# Patient Record
Sex: Female | Born: 1980
Health system: Southern US, Community
[De-identification: ages and names within clinical notes are randomized; demographics above are authoritative.]

## PROBLEM LIST (undated history)

## (undated) ENCOUNTER — Ambulatory Visit

## (undated) DIAGNOSIS — K219 Gastro-esophageal reflux disease without esophagitis: Secondary | ICD-10-CM

## (undated) DIAGNOSIS — F419 Anxiety disorder, unspecified: Secondary | ICD-10-CM

## (undated) DIAGNOSIS — G43909 Migraine, unspecified, not intractable, without status migrainosus: Secondary | ICD-10-CM

## (undated) DIAGNOSIS — F32A Depression, unspecified: Secondary | ICD-10-CM

## (undated) DIAGNOSIS — T7840XA Allergy, unspecified, initial encounter: Secondary | ICD-10-CM

## (undated) HISTORY — PX: TUBAL LIGATION: SHX77

---

## 2018-12-03 ENCOUNTER — Encounter (HOSPITAL_COMMUNITY): Payer: Self-pay | Admitting: *Deleted

## 2018-12-03 ENCOUNTER — Emergency Department (HOSPITAL_COMMUNITY)
Admission: EM | Admit: 2018-12-03 | Discharge: 2018-12-03 | Disposition: A | Discharge status: Home / Self Care | Payer: PRIVATE HEALTH INSURANCE | Attending: Emergency Medicine | Admitting: Emergency Medicine

## 2018-12-03 ENCOUNTER — Other Ambulatory Visit: Payer: Self-pay

## 2018-12-03 DIAGNOSIS — Z79899 Other long term (current) drug therapy: Secondary | ICD-10-CM | POA: Diagnosis not present

## 2018-12-03 DIAGNOSIS — L0291 Cutaneous abscess, unspecified: Secondary | ICD-10-CM

## 2018-12-03 DIAGNOSIS — R1013 Epigastric pain: Secondary | ICD-10-CM | POA: Insufficient documentation

## 2018-12-03 DIAGNOSIS — L02411 Cutaneous abscess of right axilla: Secondary | ICD-10-CM | POA: Insufficient documentation

## 2018-12-03 DIAGNOSIS — M79621 Pain in right upper arm: Secondary | ICD-10-CM | POA: Diagnosis present

## 2018-12-03 HISTORY — DX: Migraine, unspecified, not intractable, without status migrainosus: G43.909

## 2018-12-03 HISTORY — DX: Gastro-esophageal reflux disease without esophagitis: K21.9

## 2018-12-03 LAB — CBC WITH DIFFERENTIAL/PLATELET
Abs Immature Granulocytes: 0.02 10*3/uL (ref 0.00–0.07)
Basophils Absolute: 0 10*3/uL (ref 0.0–0.1)
Basophils Relative: 0 %
Eosinophils Absolute: 0.1 10*3/uL (ref 0.0–0.5)
Eosinophils Relative: 2 %
HCT: 39 % (ref 36.0–46.0)
Hemoglobin: 12.7 g/dL (ref 12.0–15.0)
Immature Granulocytes: 0 %
Lymphocytes Relative: 29 %
Lymphs Abs: 2.1 10*3/uL (ref 0.7–4.0)
MCH: 29.9 pg (ref 26.0–34.0)
MCHC: 32.6 g/dL (ref 30.0–36.0)
MCV: 91.8 fL (ref 80.0–100.0)
Monocytes Absolute: 0.5 10*3/uL (ref 0.1–1.0)
Monocytes Relative: 7 %
Neutro Abs: 4.7 10*3/uL (ref 1.7–7.7)
Neutrophils Relative %: 62 %
Platelets: 342 10*3/uL (ref 150–400)
RBC: 4.25 MIL/uL (ref 3.87–5.11)
RDW: 11.8 % (ref 11.5–15.5)
WBC: 7.5 10*3/uL (ref 4.0–10.5)
nRBC: 0 % (ref 0.0–0.2)

## 2018-12-03 LAB — COMPREHENSIVE METABOLIC PANEL
ALT: 14 U/L (ref 0–44)
AST: 14 U/L — ABNORMAL LOW (ref 15–41)
Albumin: 3.6 g/dL (ref 3.5–5.0)
Alkaline Phosphatase: 77 U/L (ref 38–126)
Anion gap: 10 (ref 5–15)
BUN: 10 mg/dL (ref 6–20)
CO2: 21 mmol/L — ABNORMAL LOW (ref 22–32)
Calcium: 8.8 mg/dL — ABNORMAL LOW (ref 8.9–10.3)
Chloride: 108 mmol/L (ref 98–111)
Creatinine, Ser: 0.52 mg/dL (ref 0.44–1.00)
GFR calc Af Amer: >60 mL/min (ref >60)
GFR calc non Af Amer: >60 mL/min (ref >60)
Glucose, Bld: 99 mg/dL (ref 70–99)
Potassium: 3.3 mmol/L — ABNORMAL LOW (ref 3.5–5.1)
Sodium: 139 mmol/L (ref 135–145)
Total Bilirubin: 0.6 mg/dL (ref 0.3–1.2)
Total Protein: 7.3 g/dL (ref 6.5–8.1)

## 2018-12-03 LAB — LIPASE, BLOOD: Lipase: 19 U/L (ref 11–51)

## 2018-12-03 MED ORDER — LIDOCAINE HCL (PF) 2 % IJ SOLN: 5.0000 mL | Freq: Once | INTRAMUSCULAR | Status: DC

## 2018-12-03 MED ORDER — POVIDONE-IODINE 10 % EX SOLN
CUTANEOUS | Status: DC | PRN
  Administered 2018-12-03: 16:00:00 via TOPICAL
  Filled 2018-12-03: qty 15

## 2018-12-03 MED ORDER — MORPHINE SULFATE (PF) 4 MG/ML IV SOLN
4.0000 mg | Freq: Once | INTRAVENOUS | Status: AC
  Administered 2018-12-03: 4 mg via INTRAVENOUS
  Filled 2018-12-03 (×2): qty 1

## 2018-12-03 MED ORDER — ONDANSETRON HCL 4 MG/2ML IJ SOLN
4.0000 mg | Freq: Once | INTRAMUSCULAR | Status: AC
  Administered 2018-12-03: 4 mg via INTRAVENOUS
  Filled 2018-12-03 (×2): qty 2

## 2018-12-03 MED ORDER — CEPHALEXIN 500 MG PO CAPS: 500.0000 mg | ORAL_CAPSULE | Freq: Four times a day (QID) | ORAL | 0 refills | Status: DC

## 2018-12-03 MED ORDER — PANTOPRAZOLE SODIUM 40 MG IV SOLR
40.0000 mg | Freq: Once | INTRAVENOUS | Status: AC
  Administered 2018-12-03: 40 mg via INTRAVENOUS
  Filled 2018-12-03: qty 40

## 2018-12-03 MED ORDER — LIDOCAINE HCL (PF) 1 % IJ SOLN
INTRAMUSCULAR | Status: AC
  Administered 2018-12-03: 30 mL
  Filled 2018-12-03: qty 30

## 2018-12-03 MED ORDER — ONDANSETRON 4 MG PO TBDP: 4.0000 mg | ORAL_TABLET | Freq: Three times a day (TID) | ORAL | 0 refills | Status: DC | PRN

## 2018-12-03 MED ORDER — LIDOCAINE HCL (PF) 1 % IJ SOLN
30.0000 mL | Freq: Once | INTRAMUSCULAR | Status: AC
  Administered 2018-12-03: 16:00:00 30 mL

## 2018-12-03 NOTE — ED Provider Notes (Signed)
[facility] EMERGENCY DEPARTMENT Provider Note   CSN: [id number] Arrival date & time: 12/03/18  1607     History   Chief Complaint Chief Complaint  Patient presents with  . Abdominal Pain  . Abscess    HPI Nichole Garcia is a 38 y.o. female with a history of acid reflux and migraine headache presenting with complaints of epigastric pain which has been present x4 days.  She denies nausea, vomiting, diarrhea, there is no radiation of pain which is constant and burning in character.  She reports history of acid reflux disease.  She denies fevers or chills.  Additionally she reports return of an abscess in her right axilla which is been present for the past week, persistent and worsening with significant pain with palpation.  Last abscess at the site was over 1 year ago.  There is been no drainage from the site.  She has used warm compresses without relief of symptoms.     The history is provided by the patient.    Past Medical History:  Diagnosis Date  . Acid reflux   . Migraines     There are no active problems to display for this patient.   Past Surgical History:  Procedure Laterality Date  . TUBAL LIGATION       OB History   No obstetric history on file.      Home Medications    Prior to Admission medications   Medication Sig Start Date End Date Taking? Authorizing Provider  acetaminophen (TYLENOL) 500 MG tablet Take 500 mg by mouth every 6 (six) hours as needed.   Yes [provider]  diphenhydrAMINE (BENADRYL ALLERGY) 25 MG tablet Take 25 mg by mouth at bedtime as needed.   Yes [provider]  ibuprofen (ADVIL) 200 MG tablet Take 200 mg by mouth every 6 (six) hours as needed.   Yes [provider]  omeprazole (PRILOSEC) 10 MG capsule Take 10 mg by mouth daily.   Yes [provider]  cephALEXin (KEFLEX) 500 MG capsule Take 1 capsule (500 mg total) by mouth 4 (four) times daily. 12/03/18   Idol, Almyra Free, PA-C  ondansetron  (ZOFRAN ODT) 4 MG disintegrating tablet Take 1 tablet (4 mg total) by mouth every 8 (eight) hours as needed for nausea or vomiting. 12/03/18   Evalee Jefferson, PA-C    Family History No family history on file.  Social History Social History   Tobacco Use  . Smoking status: Never Smoker  . Smokeless tobacco: Never Used  Substance Use Topics  . Alcohol use: Yes    Comment: occasionally on weekends  . Drug use: Yes    Types: Marijuana     Allergies   Bee venom and Asa [aspirin]   Review of Systems Review of Systems  Constitutional: Negative for fever.  HENT: Negative for congestion and sore throat.   Eyes: Negative.   Respiratory: Negative for chest tightness and shortness of breath.   Cardiovascular: Negative for chest pain.  Gastrointestinal: Positive for abdominal pain. Negative for nausea.  Genitourinary: Negative.   Musculoskeletal: Negative for arthralgias, joint swelling and neck pain.  Skin: Negative.  Negative for rash and wound.       Negative except as mentioned in HPI.   Neurological: Negative for dizziness, weakness, light-headedness, numbness and headaches.  Psychiatric/Behavioral: Negative.      Physical Exam Updated Vital Signs BP (!) 172/111   Pulse 78   Temp 98.7 F (37.1 C) (Oral)   Resp 16  Ht 5' 4" (1.626 m)   Wt 79.4 kg   LMP 11/23/2018   SpO2 100%   BMI 30.04 kg/m   Physical Exam Vitals signs and nursing note reviewed.  Constitutional:      Appearance: She is well-developed.  HENT:     Head: Normocephalic and atraumatic.  Eyes:     Conjunctiva/sclera: Conjunctivae normal.  Neck:     Musculoskeletal: Normal range of motion.  Cardiovascular:     Rate and Rhythm: Normal rate and regular rhythm.     Heart sounds: Normal heart sounds.  Pulmonary:     Effort: Pulmonary effort is normal.     Breath sounds: Normal breath sounds. No wheezing.  Abdominal:     General: Bowel sounds are normal.     Palpations: Abdomen is soft.      Tenderness: There is abdominal tenderness in the epigastric area. There is no guarding or rebound.     Hernia: No hernia is present.  Musculoskeletal: Normal range of motion.  Skin:    General: Skin is warm and dry.     Comments: Fluctuant abscess right axilla.  She has a well-healed incision from prior I&D.  No red streaking.  No pointing or tenting.  Neurological:     Mental Status: She is alert.      ED Treatments / Results  Labs (all labs ordered are listed, but only abnormal results are displayed) Labs Reviewed  COMPREHENSIVE METABOLIC PANEL - Abnormal; Notable for the following components:      Result Value   Potassium 3.3 (*)    CO2 21 (*)    Calcium 8.8 (*)    AST 14 (*)    All other components within normal limits  CBC WITH DIFFERENTIAL/PLATELET  LIPASE, BLOOD  URINALYSIS, ROUTINE W REFLEX MICROSCOPIC  POC URINE PREG, ED    EKG None  Radiology No results found.  Procedures Procedures (including critical care time)  INCISION AND DRAINAGE Performed by: Evalee Jefferson Consent: Verbal consent obtained. Risks and benefits: risks, benefits and alternatives were discussed Type: abscess  Body area: right axilla  Anesthesia: local infiltration  Incision was made with a scalpel.  Local anesthetic: lidocaine 1% without epinephrine  Anesthetic total: 5 ml  Complexity: complex Blunt dissection to break up loculations  Drainage: purulent  Drainage amount: moderate  Packing material: 1/2 in iodoform gauze  Patient tolerance: Patient tolerated the procedure well with no immediate complications.     Medications Ordered in ED Medications  povidone-iodine (BETADINE) 10 % external solution ( Topical Given by Other 12/03/18 1543)  morphine 4 MG/ML injection 4 mg (4 mg Intravenous Given 12/03/18 1242)  ondansetron (ZOFRAN) injection 4 mg (4 mg Intravenous Given 12/03/18 1241)  lidocaine (PF) (XYLOCAINE) 1 % injection 30 mL (30 mLs Other Given by Other 12/03/18  1543)  pantoprazole (PROTONIX) injection 40 mg (40 mg Intravenous Given 12/03/18 1241)     Initial Impression / Assessment and Plan / ED Course  I have reviewed the triage vital signs and the nursing notes.  Pertinent labs & imaging results that were available during my care of the patient were reviewed by me and considered in my medical decision making (see chart for details).        Delays in obtaining labs while this patient was here, initially because she refused lab work.  I was not notified by the nursing staff of patient's refusal initially, but by the time I was made aware, patient had agreed to have blood work  drawn.  Her CBC resulted, unfortunately the blood work for her c-Met and lipase had hemolyzed, therefore had to be recollected.  Patient was unwilling to wait for lab results, stating significant other had to get to work..  She was given Protonix and a dose of IV morphine when she first arrived.  She had complete resolution of her abdominal symptoms.  Patient will be prescribed antibiotics for her abscess.  She is aware that lab tests are still pending.  Given she is abdominal pain symptom-free and has had no nausea or vomiting while here and a normal WBC count, she was discharged to home, but given strict return precautions to return for any worsening symptoms.  Final Clinical Impressions(s) / ED Diagnoses   Final diagnoses:  Epigastric pain  Abscess    ED Discharge Orders         Ordered    cephALEXin (KEFLEX) 500 MG capsule  4 times daily     12/03/18 1542    ondansetron (ZOFRAN ODT) 4 MG disintegrating tablet  Every 8 hours PRN     12/03/18 1543           Evalee Jefferson, PA-C 12/03/18 1545    Nat Christen, MD 12/04/18 [phone]

## 2018-12-03 NOTE — ED Triage Notes (Signed)
Pt c/o sharp epigastric pain since Sunday. Denies n/v/d. Pt reports hx of acid reflux. Pt also c/o abscess under right arm since Friday.

## 2018-12-03 NOTE — ED Notes (Signed)
Pt reported to RN that she did not want to have the bloodwork done or the IV with medications given. Pt states, "I just want to get this thing lanced and then get out of here".

## 2018-12-03 NOTE — Discharge Instructions (Addendum)
Take the entire course of the antibiotic prescribed.  You may remove the packing from your abscess in 2 days if it is still present.  Start a warm water soak twice daily and gently massage around the site to facilitate any further drainage as discussed.  Your lab tests have resulted today and are normal with no evidence that your abdominal pain was related to gallbladder attack.  This may be acid reflux, I recommend you continue taking your over-the-counter Protonix.  I have also prescribed you some Zofran if you needed for return of any nausea.  Get rechecked if your pain returns or worsens or if you develop fevers or chills or any new symptoms.

## 2018-12-03 NOTE — Progress Notes (Signed)
CSW at bedside to address concern related to lack of primary care. Pt explains that she has recently obtained health insurance through her employer and is agreeable to being assited finding primary care.   CSW assisted pt in exploring providers that are in network with her insurance. CSW attempted to call provider of patients choice without success. VM left requesting call back to make new patient appointment.   Provider information provided on discharge summary instructing patient to call and schedule new patient appointment.   [facility]  Clinical Social Worker  Ph: [phone]

## 2019-08-15 ENCOUNTER — Other Ambulatory Visit: Payer: Self-pay

## 2019-08-15 ENCOUNTER — Ambulatory Visit
Admission: EM | Admit: 2019-08-15 | Discharge: 2019-08-15 | Disposition: A | Discharge status: Home / Self Care | Payer: PRIVATE HEALTH INSURANCE | Attending: Emergency Medicine | Admitting: Emergency Medicine

## 2019-08-15 ENCOUNTER — Encounter: Payer: Self-pay | Admitting: Emergency Medicine

## 2019-08-15 DIAGNOSIS — S161XXA Strain of muscle, fascia and tendon at neck level, initial encounter: Secondary | ICD-10-CM

## 2019-08-15 DIAGNOSIS — M79602 Pain in left arm: Secondary | ICD-10-CM

## 2019-08-15 DIAGNOSIS — M25561 Pain in right knee: Secondary | ICD-10-CM | POA: Diagnosis not present

## 2019-08-15 MED ORDER — NAPROXEN 500 MG PO TABS: 500.0000 mg | ORAL_TABLET | Freq: Two times a day (BID) | ORAL | 0 refills | Status: DC

## 2019-08-15 MED ORDER — CYCLOBENZAPRINE HCL 5 MG PO TABS: 5.0000 mg | ORAL_TABLET | Freq: Every evening | ORAL | 0 refills | Status: DC | PRN
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
## 2019-08-15 NOTE — Discharge Instructions (Addendum)
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
## 2019-08-16 ENCOUNTER — Encounter: Payer: Self-pay | Admitting: Emergency Medicine

## 2019-08-22 ENCOUNTER — Encounter: Payer: Self-pay | Admitting: Emergency Medicine

## 2019-08-22 ENCOUNTER — Ambulatory Visit
Admission: EM | Admit: 2019-08-22 | Discharge: 2019-08-22 | Disposition: A | Discharge status: Home / Self Care | Payer: Self-pay | Attending: Physician Assistant | Admitting: Physician Assistant

## 2019-08-22 ENCOUNTER — Other Ambulatory Visit: Payer: Self-pay

## 2019-08-22 DIAGNOSIS — L2489 Irritant contact dermatitis due to other agents: Secondary | ICD-10-CM

## 2019-08-22 DIAGNOSIS — R6884 Jaw pain: Secondary | ICD-10-CM

## 2019-08-22 DIAGNOSIS — M545 Low back pain, unspecified: Secondary | ICD-10-CM

## 2019-08-22 MED ORDER — DICLOFENAC SODIUM 50 MG PO TBEC: 50.0000 mg | DELAYED_RELEASE_TABLET | Freq: Two times a day (BID) | ORAL | 0 refills | Status: DC

## 2019-08-22 MED ORDER — TRIAMCINOLONE ACETONIDE 0.1 % EX CREA: 1.0000 "application " | TOPICAL_CREAM | Freq: Two times a day (BID) | CUTANEOUS | 0 refills | Status: DC

## 2019-08-22 MED ORDER — TIZANIDINE HCL 2 MG PO TABS: 2.0000 mg | ORAL_TABLET | Freq: Three times a day (TID) | ORAL | 0 refills | Status: DC | PRN

## 2019-08-22 MED ORDER — TRIAMCINOLONE ACETONIDE 0.025 % EX OINT: 1.0000 "application " | TOPICAL_OINTMENT | Freq: Two times a day (BID) | CUTANEOUS | 0 refills | Status: DC

## 2019-08-22 NOTE — Discharge Instructions (Signed)
Start Diclofenac. Do not take ibuprofen (motrin/advil)/ naproxen (aleve) while on diclofenac. Tizanidine as needed, this can make you drowsy, so do not take if you are going to drive, operate heavy machinery, or make important decisions. Ice compress for the facial swelling. Follow up with PCP/orthopedics if symptoms worsen, changes for reevaluation. If experience numbness/tingling of the inner thighs, loss of bladder or bowel control, go to the emergency department for evaluation.   Start triamcinolone cream to the rash during the day. Triamcinolone ointment at night time.
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
## 2019-09-16 ENCOUNTER — Other Ambulatory Visit: Payer: Self-pay | Admitting: Sleep Medicine

## 2019-09-16 ENCOUNTER — Other Ambulatory Visit: Payer: PRIVATE HEALTH INSURANCE

## 2019-09-16 DIAGNOSIS — I471 Supraventricular tachycardia: Secondary | ICD-10-CM

## 2019-09-18 LAB — NOVEL CORONAVIRUS, NAA: SARS-CoV-2, NAA: NOT DETECTED

## 2020-01-18 ENCOUNTER — Ambulatory Visit
Admission: EM | Admit: 2020-01-18 | Discharge: 2020-01-18 | Disposition: A | Discharge status: Home / Self Care | Payer: PRIVATE HEALTH INSURANCE | Attending: Emergency Medicine | Admitting: Emergency Medicine

## 2020-01-18 ENCOUNTER — Other Ambulatory Visit: Payer: Self-pay

## 2020-01-18 DIAGNOSIS — R509 Fever, unspecified: Secondary | ICD-10-CM | POA: Diagnosis not present

## 2020-01-18 DIAGNOSIS — L02412 Cutaneous abscess of left axilla: Secondary | ICD-10-CM

## 2020-01-18 DIAGNOSIS — Z20822 Contact with and (suspected) exposure to covid-19: Secondary | ICD-10-CM

## 2020-01-18 MED ORDER — CEFTRIAXONE SODIUM 1 G IJ SOLR
1.0000 g | Freq: Once | INTRAMUSCULAR | Status: AC
  Administered 2020-01-18: 1 g via INTRAMUSCULAR

## 2020-01-18 MED ORDER — DOXYCYCLINE HYCLATE 100 MG PO CAPS: 100.0000 mg | ORAL_CAPSULE | Freq: Two times a day (BID) | ORAL | 0 refills | Status: AC

## 2020-01-18 MED ORDER — IBUPROFEN 800 MG PO TABS: 800.0000 mg | ORAL_TABLET | Freq: Three times a day (TID) | ORAL | 0 refills | Status: DC

## 2020-01-18 NOTE — Discharge Instructions (Signed)
We gave you an injection of Rocephin Begin doxycycline twice daily for the next 10 days Tylenol and ibuprofen for pain Warm compresses Please follow-up in 2 to 3 days here or emergency room if developing any worsening symptoms

## 2020-01-18 NOTE — ED Triage Notes (Signed)
Pt c/o 3 abscess under lt arm x1wk. Pt c/o body aches with fever x3 days, requesting covid testing.

## 2020-01-18 NOTE — ED Provider Notes (Signed)
[facility]    CSN: [id number] Arrival date & time: 01/18/20  1759      History   Chief Complaint Chief Complaint  Patient presents with  . Abscess    HPI Nichole Garcia is a 40 y.o. female presenting today for evaluation of an abscess.  Reports multiple abscesses to left axilla for approximately 1 week.  Over the past 3 days she has had fevers chills and body aches, unsure if this is related to abscesses/infection or possible Covid.  Reports possible exposure.  Denies any URI symptoms.  HPI  Past Medical History:  Diagnosis Date  . Acid reflux   . Migraines     There are no problems to display for this patient.   Past Surgical History:  Procedure Laterality Date  . TUBAL LIGATION      OB History   No obstetric history on file.      Home Medications    Prior to Admission medications   Medication Sig Start Date End Date Taking? Authorizing Provider  doxycycline (VIBRAMYCIN) 100 MG capsule Take 1 capsule (100 mg total) by mouth 2 (two) times daily for 10 days. 01/18/20 01/28/20 Yes Sani Madariaga C, PA-C  ibuprofen (ADVIL) 800 MG tablet Take 1 tablet (800 mg total) by mouth 3 (three) times daily. 01/18/20  Yes Shawntelle Ungar C, PA-C  acetaminophen (TYLENOL) 500 MG tablet Take 500 mg by mouth every 6 (six) hours as needed.    [provider]  diphenhydrAMINE (BENADRYL ALLERGY) 25 MG tablet Take 25 mg by mouth at bedtime as needed.    [provider]  omeprazole (PRILOSEC) 10 MG capsule Take 10 mg by mouth daily.    [provider]    Family History Family History  Problem Relation Age of Onset  . Hypertension Mother   . Diabetes Mother     Social History Social History   Tobacco Use  . Smoking status: Never Smoker  . Smokeless tobacco: Never Used  Vaping Use  . Vaping Use: Former  Substance Use Topics  . Alcohol use: Yes    Comment: occasionally on weekends  . Drug use: Yes    Types: Marijuana     Allergies    Bee venom and Asa [aspirin]   Review of Systems Review of Systems  Constitutional: Positive for fever. Negative for fatigue.  Eyes: Negative for visual disturbance.  Respiratory: Negative for shortness of breath.   Cardiovascular: Negative for chest pain.  Gastrointestinal: Negative for abdominal pain, nausea and vomiting.  Musculoskeletal: Negative for arthralgias and joint swelling.  Skin: Positive for color change and rash. Negative for wound.  Neurological: Positive for headaches. Negative for dizziness, weakness and light-headedness.     Physical Exam Triage Vital Signs ED Triage Vitals  Enc Vitals Group     BP      Pulse      Resp      Temp      Temp src      SpO2      Weight      Height      Head Circumference      Peak Flow      Pain Score      Pain Loc      Pain Edu?      Excl. in GC?    No data found.  Updated Vital Signs BP (!) 138/96 (BP Location: Left Arm)   Pulse 68   Temp 100.3 F (37.9 C) (Oral)  Resp 18   LMP 12/21/2019   SpO2 100%   Visual Acuity Right Eye Distance:   Left Eye Distance:   Bilateral Distance:    Right Eye Near:   Left Eye Near:    Bilateral Near:     Physical Exam Vitals and nursing note reviewed.  Constitutional:      Appearance: She is well-developed and well-nourished.     Comments: No acute distress  HENT:     Head: Normocephalic and atraumatic.     Nose: Nose normal.     Mouth/Throat:     Comments: Oral mucosa pink and moist, no tonsillar enlargement or exudate. Posterior pharynx patent and nonerythematous, no uvula deviation or swelling. Normal phonation. Eyes:     Conjunctiva/sclera: Conjunctivae normal.  Cardiovascular:     Rate and Rhythm: Normal rate.  Pulmonary:     Effort: Pulmonary effort is normal. No respiratory distress.     Comments: Breathing comfortably at rest, CTABL, no wheezing, rales or other adventitious sounds auscultated Abdominal:     General: There is no distension.   Musculoskeletal:        General: Normal range of motion.     Cervical back: Neck supple.  Skin:    General: Skin is warm and dry.     Comments: Left axilla with multiple abscesses, 2 areas appear fluctuant with surrounding erythema and induration, 2 smaller less fluctuant areas but still tender  Neurological:     Mental Status: She is alert and oriented to person, place, and time.  Psychiatric:        Mood and Affect: Mood and affect normal.      UC Treatments / Results  Labs (all labs ordered are listed, but only abnormal results are displayed) Labs Reviewed  NOVEL CORONAVIRUS, NAA    EKG   Radiology No results found.  Procedures Procedures (including critical care time)  Medications Ordered in UC Medications  cefTRIAXone (ROCEPHIN) injection 1 g (has no administration in time range)    Initial Impression / Assessment and Plan / UC Course  I have reviewed the triage vital signs and the nursing notes.  Pertinent labs & imaging results that were available during my care of the patient were reviewed by me and considered in my medical decision making (see chart for details).     1.  Left axilla abscesses-recommended I&D to 2 areas that are fluctuant, patient became nervous about this and became irritated/hyperventilating, discussed deferring I&D and trial of antibiotics as alternative.  Initiated on doxycycline.  Patient does have fever today, unclear if this is related to abscesses versus possible Covid, opting to cover for possible correlation to abscesses with Rocephin.  Anti-inflammatories for pain.  2.  Fever-Covid test pending, Tylenol and ibuprofen for fever  Discussed strict return precautions. Patient verbalized understanding and is agreeable with plan.  Final Clinical Impressions(s) / UC Diagnoses   Final diagnoses:  Encounter for screening laboratory testing for COVID-19 virus  Abscess of left axilla  Fever, unspecified     Discharge Instructions      We gave you an injection of Rocephin Begin doxycycline twice daily for the next 10 days Tylenol and ibuprofen for pain Warm compresses Please follow-up in 2 to 3 days here or emergency room if developing any worsening symptoms    ED Prescriptions    Medication Sig Dispense Auth. Provider   doxycycline (VIBRAMYCIN) 100 MG capsule Take 1 capsule (100 mg total) by mouth 2 (two) times daily for 10 days.  20 capsule Rachal Dvorsky C, PA-C   ibuprofen (ADVIL) 800 MG tablet Take 1 tablet (800 mg total) by mouth 3 (three) times daily. 21 tablet Treylin Burtch, [place] C, PA-C     PDMP not reviewed this encounter.   Janith Lima, [place] 01/18/20 [phone]

## 2020-01-21 LAB — NOVEL CORONAVIRUS, NAA: SARS-CoV-2, NAA: DETECTED — AB

## 2020-03-18 ENCOUNTER — Other Ambulatory Visit: Payer: Self-pay

## 2020-03-18 ENCOUNTER — Encounter (HOSPITAL_COMMUNITY): Payer: Self-pay

## 2020-03-18 ENCOUNTER — Emergency Department (HOSPITAL_COMMUNITY)
Admission: EM | Admit: 2020-03-18 | Discharge: 2020-03-18 | Disposition: A | Discharge status: Home / Self Care | Payer: PRIVATE HEALTH INSURANCE | Attending: Emergency Medicine | Admitting: Emergency Medicine

## 2020-03-18 DIAGNOSIS — K047 Periapical abscess without sinus: Secondary | ICD-10-CM | POA: Insufficient documentation

## 2020-03-18 DIAGNOSIS — L02412 Cutaneous abscess of left axilla: Secondary | ICD-10-CM | POA: Insufficient documentation

## 2020-03-18 MED ORDER — LIDOCAINE-EPINEPHRINE (PF) 2 %-1:200000 IJ SOLN
20.0000 mL | Freq: Once | INTRAMUSCULAR | Status: DC
  Filled 2020-03-18: qty 20

## 2020-03-18 MED ORDER — AMOXICILLIN-POT CLAVULANATE 875-125 MG PO TABS: 1.0000 | ORAL_TABLET | Freq: Two times a day (BID) | ORAL | 0 refills | Status: DC

## 2020-03-18 MED ORDER — CHLORHEXIDINE GLUCONATE 0.12% ORAL RINSE (MEDLINE KIT): 15.0000 mL | Freq: Two times a day (BID) | OROMUCOSAL | 0 refills | Status: DC

## 2020-03-18 NOTE — Discharge Instructions (Signed)
You were given a prescription for antibiotics. Please take the antibiotic prescription fully.   Please follow-up with a dentist in the next 5 to 7 days for reevaluation.  If you do not have a dentist, resources were provided for dentist in the area in your discharge summary.   I have also given you information to follow-up with a general surgeon in regards to your recurrent abscesses in your armpit.  Please call the office schedule an appointment for follow-up.   Please contact one of the offices that are listed and make an appointment for follow-up.  Please return to the emergency department for any new or worsening symptoms.

## 2020-03-18 NOTE — ED Triage Notes (Signed)
Pt reports abscess to left axilla area that she noticed 3 days ago. Pt also reports abscessed tooth (right,bottom).

## 2020-03-18 NOTE — ED Provider Notes (Signed)
[facility] EMERGENCY DEPARTMENT Provider Note   CSN: [id number] Arrival date & time: 03/18/20  1948     History Chief Complaint  Patient presents with  . Abscess    And toothache -right bottom    Nichole Garcia is a 40 y.o. female.  HPI   40 year old female history of acid reflux, migraines, presents the emergency department today for evaluation of multiple abscesses.  States for the last several days she has noted some swelling and pain to the left axillary area.  She has a history of recurrent abscess to this area.  She also notes some swelling to the right lower jaw and some tooth pain.  She has had no associated fevers or systemic symptoms with this.  She is had no drainage from either of the abscesses.  Past Medical History:  Diagnosis Date  . Acid reflux   . Migraines     There are no problems to display for this patient.   Past Surgical History:  Procedure Laterality Date  . TUBAL LIGATION       OB History   No obstetric history on file.     Family History  Problem Relation Age of Onset  . Hypertension Mother   . Diabetes Mother     Social History   Tobacco Use  . Smoking status: Never Smoker  . Smokeless tobacco: Never Used  Vaping Use  . Vaping Use: Former  Substance Use Topics  . Alcohol use: Yes    Comment: occasionally on weekends  . Drug use: Yes    Types: Marijuana    Home Medications Prior to Admission medications   Medication Sig Start Date End Date Taking? Authorizing Provider  amoxicillin-clavulanate (AUGMENTIN) 875-125 MG tablet Take 1 tablet by mouth every 12 (twelve) hours. 03/18/20  Yes Daina Cara S, PA-C  chlorhexidine gluconate, MEDLINE KIT, (PERIDEX) 0.12 % solution Use as directed 15 mLs in the mouth or throat 2 (two) times daily. 03/18/20  Yes Arcelia Pals S, PA-C  acetaminophen (TYLENOL) 500 MG tablet Take 500 mg by mouth every 6 (six) hours as needed.    [provider]  diphenhydrAMINE (BENADRYL ALLERGY) 25 MG  tablet Take 25 mg by mouth at bedtime as needed.    [provider]  ibuprofen (ADVIL) 800 MG tablet Take 1 tablet (800 mg total) by mouth 3 (three) times daily. 01/18/20   Wieters, Hallie C, PA-C  omeprazole (PRILOSEC) 10 MG capsule Take 10 mg by mouth daily.    [provider]    Allergies    Bee venom and Asa [aspirin]  Review of Systems   Review of Systems  Constitutional: Negative for fever.  Skin:       abscess    Physical Exam Updated Vital Signs BP (!) 151/98 (BP Location: Right Arm)   Pulse 95   Temp 98.7 F (37.1 C) (Oral)   Resp 18   Ht 5' 4" (1.626 m)   Wt 72.6 kg   SpO2 100%   BMI 27.46 kg/m   Physical Exam Vitals and nursing note reviewed.  Constitutional:      General: She is not in acute distress.    Appearance: She is well-developed and well-nourished.  HENT:     Head: Normocephalic and atraumatic.     Mouth/Throat:     Comments: TTP to the right lower molar with fluctuance noted to the adjacent gumline.  There is no submandibular or sublingual swelling.  No trismus noted.  Tolerating secretions. Eyes:  Conjunctiva/sclera: Conjunctivae normal.  Cardiovascular:     Rate and Rhythm: Normal rate.  Pulmonary:     Effort: Pulmonary effort is normal.  Musculoskeletal:        General: Normal range of motion.     Cervical back: Neck supple.  Skin:    General: Skin is warm and dry.     Comments: 1 x 3 cm area of fluctuance to the left axillary area with a 1 x 1 cm area of fluctuance just inferior to this.  Both areas are tender to palpation.  Neurological:     Mental Status: She is alert.  Psychiatric:        Mood and Affect: Mood and affect normal.     ED Results / Procedures / Treatments   Labs (all labs ordered are listed, but only abnormal results are displayed) Labs Reviewed - No data to display  EKG None  Radiology No results found.  Procedures .Marland KitchenIncision and Drainage  Date/Time: 03/18/2020 11:34 PM Performed by:  Rodney Booze, PA-C Authorized by: Rodney Booze, PA-C   Consent:    Consent obtained:  Verbal   Consent given by:  Patient   Risks discussed:  Bleeding, incomplete drainage, pain and damage to other organs   Alternatives discussed:  No treatment Universal protocol:    Procedure explained and questions answered to patient or proxy's satisfaction: yes     Relevant documents present and verified: yes     Test results available : yes     Imaging studies available: yes     Required blood products, implants, devices, and special equipment available: yes     Site/side marked: yes     Immediately prior to procedure, a time out was called: yes     Patient identity confirmed:  Verbally with patient Location:    Type:  Abscess   Size:  1   Location:  Mouth Procedure type:    Complexity:  Simple Procedure details:    Incision types:  Single straight   Incision depth:  Subcutaneous   Scalpel blade:  11   Drainage:  Purulent   Drainage amount:  Scant Post-procedure details:    Procedure completion:  Tolerated well, no immediate complications .Marland KitchenIncision and Drainage  Date/Time: 03/18/2020 11:35 PM Performed by: Rodney Booze, PA-C Authorized by: Rodney Booze, PA-C   Consent:    Consent obtained:  Verbal   Consent given by:  Patient   Risks discussed:  Bleeding, incomplete drainage, pain and damage to other organs   Alternatives discussed:  No treatment Universal protocol:    Procedure explained and questions answered to patient or proxy's satisfaction: yes     Relevant documents present and verified: yes     Test results available : yes     Imaging studies available: yes     Required blood products, implants, devices, and special equipment available: yes     Site/side marked: yes     Immediately prior to procedure, a time out was called: yes     Patient identity confirmed:  Verbally with patient Location:    Type:  Abscess   Size:  3   Location: left  axilla. Pre-procedure details:    Skin preparation:  Chlorhexidine with alcohol Sedation:    Sedation type:  None Anesthesia:    Anesthesia method:  Local infiltration   Local anesthetic:  Lidocaine 2% WITH epi Procedure type:    Complexity:  Complex Procedure details:    Incision types:  Single straight   Incision depth:  Subcutaneous   Scalpel blade:  11   Wound management:  Probed and deloculated   Drainage:  Purulent   Drainage amount:  Moderate Post-procedure details:    Procedure completion:  Tolerated well, no immediate complications ..Incision and Drainage  Date/Time: 03/18/2020 11:35 PM Performed by: Couture, Cortni S, PA-C Authorized by: Couture, Cortni S, PA-C   Consent:    Consent obtained:  Verbal   Consent given by:  Patient   Risks discussed:  Bleeding, incomplete drainage, pain and damage to other organs   Alternatives discussed:  No treatment Universal protocol:    Procedure explained and questions answered to patient or proxy's satisfaction: yes     Relevant documents present and verified: yes     Test results available : yes     Imaging studies available: yes     Required blood products, implants, devices, and special equipment available: yes     Site/side marked: yes     Immediately prior to procedure, a time out was called: yes     Patient identity confirmed:  Verbally with patient Location:    Type:  Abscess   Size:  1   Location: left axilla. Pre-procedure details:    Skin preparation:  Chlorhexidine with alcohol Sedation:    Sedation type:  None Anesthesia:    Anesthesia method:  Local infiltration   Local anesthetic:  Lidocaine 2% WITH epi Procedure type:    Complexity:  Simple Procedure details:    Incision types:  Single straight   Incision depth:  Subcutaneous   Scalpel blade:  11   Wound management:  Probed and deloculated   Drainage:  Purulent   Drainage amount:  Scant Post-procedure details:    Procedure completion:  Tolerated  well, no immediate complications     Medications Ordered in ED Medications  lidocaine-EPINEPHrine (XYLOCAINE W/EPI) 2 %-1:200000 (PF) injection 20 mL (has no administration in time range)    ED Course  I have reviewed the triage vital signs and the nursing notes.  Pertinent labs & imaging results that were available during my care of the patient were reviewed by me and considered in my medical decision making (see chart for details).    MDM Rules/Calculators/A&P                          Patient with skin abscess amenable to incision and drainage.  Abscesses were not large enough to warrant packing or drain. Encouraged home warm soaks and flushing.    Will d/c to home with antibiotics and oral mouthwash.  Will give information for dentist for follow-up regarding her dental abscess and will also give information to follow-up with general surgery regarding her recurrent axillary abscesses.  Advised on return precautions.  She voices understanding of the plan and reasons to return. all Questions answered.  Patient stable for discharge.    Final Clinical Impression(s) / ED Diagnoses Final diagnoses:  Dental abscess  Abscess of left axilla    Rx / DC Orders ED Discharge Orders         Ordered    amoxicillin-clavulanate (AUGMENTIN) 875-125 MG tablet  Every 12 hours        03/18/20 2333    chlorhexidine gluconate, MEDLINE KIT, (PERIDEX) 0.12 % solution  2 times daily        03/18/20 2333           Couture, Cortni S, PA-C 03/18/20 2336      Zammit, Joseph, MD 03/22/20 2307  

## 2020-07-28 ENCOUNTER — Ambulatory Visit
Admission: EM | Admit: 2020-07-28 | Discharge: 2020-07-28 | Disposition: A | Discharge status: Home / Self Care | Payer: 59 | Attending: Nurse Practitioner | Admitting: Nurse Practitioner

## 2020-07-28 DIAGNOSIS — L02212 Cutaneous abscess of back [any part, except buttock]: Secondary | ICD-10-CM | POA: Diagnosis not present

## 2020-07-28 DIAGNOSIS — L02411 Cutaneous abscess of right axilla: Secondary | ICD-10-CM | POA: Diagnosis not present

## 2020-07-28 MED ORDER — HYDROCODONE-ACETAMINOPHEN 5-325 MG PO TABS: 1.0000 | ORAL_TABLET | ORAL | 0 refills | Status: DC | PRN

## 2020-07-28 MED ORDER — CLINDAMYCIN HCL 150 MG PO CAPS: 300.0000 mg | ORAL_CAPSULE | Freq: Three times a day (TID) | ORAL | 0 refills | Status: AC

## 2020-07-28 MED ORDER — SULFAMETHOXAZOLE-TRIMETHOPRIM 800-160 MG PO TABS: 1.0000 | ORAL_TABLET | Freq: Two times a day (BID) | ORAL | 0 refills | Status: AC

## 2020-07-28 NOTE — ED Provider Notes (Signed)
[facility]    CSN: [id number] Arrival date & time: 07/28/20  1247      History   Chief Complaint Chief Complaint  Patient presents with   Abscess    Mid back and right armpit     HPI Nichole Garcia is a 40 y.o. female.   Subjective:   Nichole Garcia is a 40 y.o. female who presents for evaluation of a multiple cutaneous abscess. Lesion is located in the right axilla and midback area. Onset was 3 days ago. Symptoms have waxed and waned but are worse overall.  Patient has been applying warm compresses as well as an over-the-counter product called "boil ease" with some relief in her symptoms.  Patient states that she has 3 abscesses on her back.  2 of the abscesses have spontaneously drained but there is 1 remaining that has not drained. Abscess has associated symptoms of spontaneous drainage and pain. She denies any fevers or myalgias. Patient does have previous history of cutaneous abscesses. Patient does not have diabetes.  The following portions of the patient's history were reviewed and updated as appropriate: allergies, current medications, past family history, past medical history, past social history, past surgical history, and problem list.      Past Medical History:  Diagnosis Date   Acid reflux    Migraines     There are no problems to display for this patient.   Past Surgical History:  Procedure Laterality Date   TUBAL LIGATION      OB History   No obstetric history on file.      Home Medications    Prior to Admission medications   Medication Sig Start Date End Date Taking? Authorizing Provider  clindamycin (CLEOCIN) 150 MG capsule Take 2 capsules (300 mg total) by mouth 3 (three) times daily for 7 days. 07/28/20 08/04/20 Yes Lurline IdolMurrill, Alayah Knouff, FNP  HYDROcodone-acetaminophen (NORCO/VICODIN) 5-325 MG tablet Take 1 tablet by mouth every 4 (four) hours as needed for severe pain. 07/28/20  Yes Lurline IdolMurrill, Trestin Vences, FNP  sulfamethoxazole-trimethoprim  (BACTRIM DS) 800-160 MG tablet Take 1 tablet by mouth 2 (two) times daily for 7 days. 07/28/20 08/04/20 Yes Lurline IdolMurrill, Elisah Parmer, FNP  acetaminophen (TYLENOL) 500 MG tablet Take 500 mg by mouth every 6 (six) hours as needed.    [provider]  diphenhydrAMINE (BENADRYL ALLERGY) 25 MG tablet Take 25 mg by mouth at bedtime as needed.    [provider]  ibuprofen (ADVIL) 800 MG tablet Take 1 tablet (800 mg total) by mouth 3 (three) times daily. 01/18/20   Wieters, Hallie C, PA-C  omeprazole (PRILOSEC) 10 MG capsule Take 10 mg by mouth daily.    [provider]    Family History Family History  Problem Relation Age of Onset   Hypertension Mother    Diabetes Mother     Social History Social History   Tobacco Use   Smoking status: Never   Smokeless tobacco: Never  Vaping Use   Vaping Use: Former  Substance Use Topics   Alcohol use: Yes    Comment: occasionally on weekends   Drug use: Yes    Types: Marijuana     Allergies   Bee venom and Asa [aspirin]   Review of Systems Review of Systems  Constitutional:  Negative for fever.  Skin:  Positive for wound.  All other systems reviewed and are negative.   Physical Exam Triage Vital Signs ED Triage Vitals  Enc Vitals Group     BP 07/28/20 1441 Marland Kitchen(!)  147/94     Pulse Rate 07/28/20 1441 95     Resp 07/28/20 1441 18     Temp 07/28/20 1441 98 F (36.7 C)     Temp Source 07/28/20 1441 Oral     SpO2 07/28/20 1441 99 %     Weight --      Height --      Head Circumference --      Peak Flow --      Pain Score 07/28/20 1442 10     Pain Loc --      Pain Edu? --      Excl. in GC? --    No data found.  Updated Vital Signs BP (!) 147/94 (BP Location: Left Arm)   Pulse 95   Temp 98 F (36.7 C) (Oral)   Resp 18   SpO2 99%   Visual Acuity Right Eye Distance:   Left Eye Distance:   Bilateral Distance:    Right Eye Near:   Left Eye Near:    Bilateral Near:     Physical Exam Vitals reviewed.   Constitutional:      Appearance: Normal appearance.  HENT:     Head: Normocephalic.  Cardiovascular:     Rate and Rhythm: Normal rate and regular rhythm.  Pulmonary:     Effort: Pulmonary effort is normal.     Breath sounds: Normal breath sounds.  Musculoskeletal:        General: Normal range of motion.     Cervical back: Normal range of motion and neck supple.  Skin:    General: Skin is warm and dry.       Neurological:     General: No focal deficit present.     Mental Status: She is alert and oriented to person, place, and time.  Psychiatric:        Mood and Affect: Mood normal.        Behavior: Behavior normal.     UC Treatments / Results  Labs (all labs ordered are listed, but only abnormal results are displayed) Labs Reviewed  AEROBIC CULTURE W GRAM STAIN (SUPERFICIAL SPECIMEN)    EKG   Radiology No results found.  Procedures Incision and Drainage  Date/Time: 07/28/2020 3:21 PM Performed by: Lurline Idol, FNP Authorized by: Lurline Idol, FNP   Consent:    Consent obtained:  Verbal   Consent given by:  Patient   Risks, benefits, and alternatives were discussed: yes     Risks discussed:  Bleeding, incomplete drainage, pain and infection   Alternatives discussed:  No treatment Location:    Type:  Abscess   Location:  Upper extremity   Upper extremity location: right axilla. Pre-procedure details:    Skin preparation:  Chlorhexidine with alcohol Sedation:    Sedation type:  None Anesthesia:    Anesthesia method:  Local infiltration   Local anesthetic:  Lidocaine 1% w/o epi Procedure type:    Complexity:  Simple Procedure details:    Incision types:  Single straight   Wound management:  Probed and deloculated   Drainage:  Purulent and bloody   Drainage amount:  Moderate   Wound treatment:  Drain placed   Packing materials:  1/4 in iodoform gauze Post-procedure details:    Procedure completion:  Tolerated well, no immediate  complications (including critical care time)  Medications Ordered in UC Medications - No data to display  Initial Impression / Assessment and Plan / UC Course  I have reviewed the triage vital signs  and the nursing notes.  Pertinent labs & imaging results that were available during my care of the patient were reviewed by me and considered in my medical decision making (see chart for details).     40 year old female presenting with an abscess to the right axilla and the back area.  The abscess to the axilla has been drained. Abscess to the back not ready for I&D at this time.  Patient is uncomfortable but nontoxic.  Afebrile.  Plan:  Apply hot compresses frequently to promote drainage. Oral antibiotics -- see med orders. I & D procedure as above. RTC in 2 days to have packing removed from the right axilla abscess and to be reevaluated for possible I&D of the back.  Today's evaluation has revealed no signs of a dangerous process. Discussed diagnosis with patient and/or guardian. Patient and/or guardian aware of their diagnosis, possible red flag symptoms to watch out for and need for close follow up. Patient and/or guardian understands verbal and written discharge instructions. Patient and/or guardian comfortable with plan and disposition.  Patient and/or guardian has a clear mental status at this time, good insight into illness (after discussion and teaching) and has clear judgment to make decisions regarding their care  This care was provided during an unprecedented National Emergency due to the Novel Coronavirus (COVID-19) pandemic. COVID-19 infections and transmission risks place heavy strains on healthcare resources.  As this pandemic evolves, our facility, providers, and staff strive to respond fluidly, to remain operational, and to provide care relative to available resources and information. Outcomes are unpredictable and treatments are without well-defined guidelines. Further, the  impact of COVID-19 on all aspects of urgent care, including the impact to patients seeking care for reasons other than COVID-19, is unavoidable during this national emergency. At this time of the global pandemic, management of patients has significantly changed, even for non-COVID positive patients given high local and regional COVID volumes at this time requiring high healthcare system and resource utilization. The standard of care for management of both COVID suspected and non-COVID suspected patients continues to change rapidly at the local, regional, national, and global levels. This patient was worked up and treated to the best available but ever changing evidence and resources available at this current time.   Documentation was completed with the aid of voice recognition software. Transcription may contain typographical errors.    Final Clinical Impressions(s) / UC Diagnoses   Final diagnoses:  Abscess of right axilla  Abscess of back     Discharge Instructions      Take antibiotics as prescribed Take over-the-counter pain medicine (aleve, tylenol or advil) as needed Use the Norco only for severe pain.  Do not drive taking this. Continue warm, moist compresses to your abscesses Come back in 2 days to have the packing removed from your abscess and to be evaluated for possible drainage of the abscess to your back      ED Prescriptions     Medication Sig Dispense Auth. Provider   sulfamethoxazole-trimethoprim (BACTRIM DS) 800-160 MG tablet Take 1 tablet by mouth 2 (two) times daily for 7 days. 14 tablet Josalyn Dettmann, Millard, FNP   clindamycin (CLEOCIN) 150 MG capsule Take 2 capsules (300 mg total) by mouth 3 (three) times daily for 7 days. 42 capsule Lurline Idol, FNP   HYDROcodone-acetaminophen (NORCO/VICODIN) 5-325 MG tablet Take 1 tablet by mouth every 4 (four) hours as needed for severe pain. 10 tablet Lurline Idol, FNP      I have reviewed the  PDMP during this  encounter.   Lurline Idol, [place] 07/28/20 1554

## 2020-07-28 NOTE — ED Triage Notes (Signed)
Three day h/o abscess on mid back and right arm pit. Has been taking Aleve, Tylenol and using warm compresses without relief.   Pt also complains of rash on bilateral forearms. Has been using hydrocortisone without improvement

## 2020-07-28 NOTE — Discharge Instructions (Addendum)
Take antibiotics as prescribed Take over-the-counter pain medicine (aleve, tylenol or advil) as needed Use the Norco only for severe pain.  Do not drive taking this. Continue warm, moist compresses to your abscesses Come back in 2 days to have the packing removed from your abscess and to be evaluated for possible drainage of the abscess to your back

## 2020-07-31 ENCOUNTER — Encounter: Payer: Self-pay | Admitting: Emergency Medicine

## 2020-07-31 ENCOUNTER — Ambulatory Visit
Admission: EM | Admit: 2020-07-31 | Discharge: 2020-07-31 | Disposition: A | Discharge status: Home / Self Care | Payer: 59 | Attending: Emergency Medicine | Admitting: Emergency Medicine

## 2020-07-31 ENCOUNTER — Other Ambulatory Visit: Payer: Self-pay

## 2020-07-31 DIAGNOSIS — L02411 Cutaneous abscess of right axilla: Secondary | ICD-10-CM

## 2020-07-31 DIAGNOSIS — Z5189 Encounter for other specified aftercare: Secondary | ICD-10-CM

## 2020-07-31 DIAGNOSIS — L02212 Cutaneous abscess of back [any part, except buttock]: Secondary | ICD-10-CM

## 2020-07-31 LAB — AEROBIC CULTURE W GRAM STAIN (SUPERFICIAL SPECIMEN)
Culture: NORMAL
Special Requests: NORMAL

## 2020-07-31 MED ORDER — HIBICLENS 4 % EX LIQD: Freq: Every day | CUTANEOUS | 0 refills | Status: DC | PRN

## 2020-07-31 NOTE — Discharge Instructions (Addendum)
Give [place] a working phone number so that we contact you if we need to change your antibiotics. Take the medication as written. Take a Tylenol containing product with the motrin up to 3 times a day as needed for pain. This is an effective combination for pain. Take the hydrocodone/Tylenol only for severe pain. Do not take the tylenol and hydrocodone/Tylenol as they both have tylenol in them and too much can hurt your liver.  Return here if you are not getting any better for an I&D.  Continue warm compresses.  Finish antibiotics.  Hibiclens soap keep back and axilla clean.  Return to the ER if you get worse, have a persistent fever >100.4, or for any concerns.  Below is a list of primary care practices who are taking new patients for you to follow-up with.  [facility] internal medicine clinic Ground Floor - [facility], [address] [place], [place], [place] [address] [phone]  [facility] at [facility] [address] [place], [place] [address] [phone]  [facility] [address] [place], [place] [address] [phone]  [facility] [phone] [address] [place] [address]  [facility]: [address] [place] [address]  [phone]  [facility]: [address] [place] [address]  [phone]  [facility] : [address] [place] [address] [phone]  [facility]: [address] [place] [address] [phone]  [facility]: [address] [place] [address] [phone]  Dr. Oneal Grout [address] [facility] [place] [address]  [phone]  Go to www.goodrx.com  or www.costplusdrugs.com to look up your medications. This will give you a list of  where you can find your prescriptions at the most affordable prices. Or ask the pharmacist what the cash price is, or if they have any other discount programs available to help make your medication more affordable. This can be less expensive than what you would pay with insurance.

## 2020-07-31 NOTE — ED Provider Notes (Signed)
HPI  SUBJECTIVE:  Nichole Garcia is a 40 y.o. female who presents for a wound check and packing removal of a right axillary abscess that was performed 3 days ago.  She states it is still draining purulent bloody material, states that the pain is better and the swelling has decreased.  No fevers, body aches.  She also reports a persistent painful erythematous mass on her right back which has not yet drained.  She is not sure if it has gotten any bigger.  No change in the pain.  She has been taking warm baths, clindamycin/Bactrim, Advil, Tylenol, Norco with some improvement in her symptoms.  Symptoms worse with lying on her back or turning over to her right.  She has a past medical history of abscesses.  No history of MRSA, diabetes.  LMP: Beginning of July.  Denies possibility of being pregnant.  PMD: None   Patient had an I&D of the right axilla on 7/14 advised to follow-up here for wound check, packing removal and was started on clindamycin, Bactrim, Norco.  Culture and sensitivity still pending.  Past Medical History:  Diagnosis Date   Acid reflux    Migraines     Past Surgical History:  Procedure Laterality Date   TUBAL LIGATION      Family History  Problem Relation Age of Onset   Hypertension Mother    Diabetes Mother     Social History   Tobacco Use   Smoking status: Never   Smokeless tobacco: Never  Vaping Use   Vaping Use: Former  Substance Use Topics   Alcohol use: Yes    Comment: occasionally on weekends   Drug use: Yes    Types: Marijuana    No current facility-administered medications for this encounter.  Current Outpatient Medications:    chlorhexidine (HIBICLENS) 4 % external liquid, Apply topically daily as needed., Disp: 120 mL, Rfl: 0   clindamycin (CLEOCIN) 150 MG capsule, Take 2 capsules (300 mg total) by mouth 3 (three) times daily for 7 days., Disp: 42 capsule, Rfl: 0   diphenhydrAMINE (BENADRYL) 25 MG tablet, Take 25 mg by mouth at bedtime as needed.,  Disp: , Rfl:    omeprazole (PRILOSEC) 10 MG capsule, Take 10 mg by mouth daily., Disp: , Rfl:    sulfamethoxazole-trimethoprim (BACTRIM DS) 800-160 MG tablet, Take 1 tablet by mouth 2 (two) times daily for 7 days., Disp: 14 tablet, Rfl: 0   acetaminophen (TYLENOL) 500 MG tablet, Take 500 mg by mouth every 6 (six) hours as needed., Disp: , Rfl:    HYDROcodone-acetaminophen (NORCO/VICODIN) 5-325 MG tablet, Take 1 tablet by mouth every 4 (four) hours as needed for severe pain., Disp: 10 tablet, Rfl: 0   ibuprofen (ADVIL) 800 MG tablet, Take 1 tablet (800 mg total) by mouth 3 (three) times daily., Disp: 21 tablet, Rfl: 0  Allergies  Allergen Reactions   Bee Venom Shortness Of Breath and Swelling   Asa [Aspirin] Other (See Comments)    "Makes me feel funny"     ROS  As noted in HPI.   Physical Exam  BP 120/81   Pulse 91   Temp 98.2 F (36.8 C) (Oral)   Resp 16   LMP 07/15/2020   SpO2 98%   Constitutional: Well developed, well nourished, no acute distress Eyes:  EOMI, conjunctiva normal bilaterally HENT: Normocephalic, atraumatic,mucus membranes moist Respiratory: Normal inspiratory effort Cardiovascular: Normal rate GI: nondistended skin: 5 x 3 cm tender area of erythema, induration with central pustule.  Small amount of induration.  No expressible purulent drainage.  0.5 x 0.5 pustule inferior to this.  Marked area of erythema, induration with marker.      Right axilla.  Packing in place.  Mildly tender mass.  No erythema.  Musculoskeletal: no deformities Neurologic: Alert & oriented x 3, no focal neuro deficits Psychiatric: Speech and behavior appropriate   ED Course   Medications - No data to display  Orders Placed This Encounter  Procedures   Apply dressing    Right axilla    Standing Status:   Standing    Number of Occurrences:   1    No results found for this or any previous visit (from the past 24 hour(s)). No results found.  ED Clinical  Impression  1. Abscess of right axilla   2. Abscess of back   3. Wound check, abscess      ED Assessment/Plan  Previous records reviewed.  As noted in HPI.  Removed packing in right axilla without any problem.  There is no expressible purulent drainage, left wound open to heal by secondary intention.  Wound redressed.  Patient declined I&D of the lesion on her back.  She is to continue warm compresses, both antibiotics, ibuprofen/Tylenol containing product as needed.  She is to return here if she gets worse for an I&D.  Home with Hibiclens.  Primary care list for ongoing care, will place order for assistance in finding a PMD.  Discussed labs, MDM, treatment plan, and plan for follow-up with patient. Discussed sn/sx that should prompt return to the ED. patient agrees with plan.   Meds ordered this encounter  Medications   chlorhexidine (HIBICLENS) 4 % external liquid    Sig: Apply topically daily as needed.    Dispense:  120 mL    Refill:  0      *This clinic note was created using Scientist, clinical (histocompatibility and immunogenetics). Therefore, there may be occasional mistakes despite careful proofreading.  ?    Domenick Gong, MD 08/01/20 1116

## 2020-07-31 NOTE — ED Triage Notes (Signed)
Abscess under right arm has been packed, wound check.  Another abscess on back that she would like checked.

## 2020-08-12 ENCOUNTER — Encounter (HOSPITAL_COMMUNITY): Payer: Self-pay

## 2020-08-13 ENCOUNTER — Other Ambulatory Visit: Payer: Self-pay

## 2020-08-13 ENCOUNTER — Ambulatory Visit (HOSPITAL_COMMUNITY)
Admission: EM | Admit: 2020-08-13 | Discharge: 2020-08-13 | Disposition: A | Discharge status: Home / Self Care | Payer: 59 | Attending: Physician Assistant | Admitting: Physician Assistant

## 2020-08-13 ENCOUNTER — Encounter (HOSPITAL_COMMUNITY): Payer: Self-pay | Admitting: *Deleted

## 2020-08-13 DIAGNOSIS — R112 Nausea with vomiting, unspecified: Secondary | ICD-10-CM | POA: Diagnosis not present

## 2020-08-13 DIAGNOSIS — K529 Noninfective gastroenteritis and colitis, unspecified: Secondary | ICD-10-CM | POA: Diagnosis not present

## 2020-08-13 DIAGNOSIS — R197 Diarrhea, unspecified: Secondary | ICD-10-CM | POA: Diagnosis not present

## 2020-08-13 LAB — COMPREHENSIVE METABOLIC PANEL
ALT: 25 U/L (ref 0–44)
AST: 23 U/L (ref 15–41)
Albumin: 3.8 g/dL (ref 3.5–5.0)
Alkaline Phosphatase: 68 U/L (ref 38–126)
Anion gap: 5 (ref 5–15)
BUN: 12 mg/dL (ref 6–20)
CO2: 24 mmol/L (ref 22–32)
Calcium: 8.8 mg/dL — ABNORMAL LOW (ref 8.9–10.3)
Chloride: 108 mmol/L (ref 98–111)
Creatinine, Ser: 0.58 mg/dL (ref 0.44–1.00)
GFR, Estimated: >60 mL/min (ref >60)
Glucose, Bld: 93 mg/dL (ref 70–99)
Potassium: 3.7 mmol/L (ref 3.5–5.1)
Sodium: 137 mmol/L (ref 135–145)
Total Bilirubin: 0.4 mg/dL (ref 0.3–1.2)
Total Protein: 7.3 g/dL (ref 6.5–8.1)

## 2020-08-13 LAB — CBC WITH DIFFERENTIAL/PLATELET
Abs Immature Granulocytes: 0.02 10*3/uL (ref 0.00–0.07)
Basophils Absolute: 0 10*3/uL (ref 0.0–0.1)
Basophils Relative: 1 %
Eosinophils Absolute: 0 10*3/uL (ref 0.0–0.5)
Eosinophils Relative: 0 %
HCT: 35.2 % — ABNORMAL LOW (ref 36.0–46.0)
Hemoglobin: 12 g/dL (ref 12.0–15.0)
Immature Granulocytes: 0 %
Lymphocytes Relative: 24 %
Lymphs Abs: 1.4 10*3/uL (ref 0.7–4.0)
MCH: 29.8 pg (ref 26.0–34.0)
MCHC: 34.1 g/dL (ref 30.0–36.0)
MCV: 87.3 fL (ref 80.0–100.0)
Monocytes Absolute: 0.3 10*3/uL (ref 0.1–1.0)
Monocytes Relative: 5 %
Neutro Abs: 4 10*3/uL (ref 1.7–7.7)
Neutrophils Relative %: 70 %
Platelets: 376 10*3/uL (ref 150–400)
RBC: 4.03 MIL/uL (ref 3.87–5.11)
RDW: 13.2 % (ref 11.5–15.5)
WBC: 5.8 10*3/uL (ref 4.0–10.5)
nRBC: 0 % (ref 0.0–0.2)

## 2020-08-13 LAB — LIPASE, BLOOD: Lipase: 23 U/L (ref 11–51)

## 2020-08-13 MED ORDER — LIDOCAINE VISCOUS HCL 2 % MT SOLN
OROMUCOSAL | Status: AC
  Filled 2020-08-13: qty 15

## 2020-08-13 MED ORDER — ONDANSETRON 4 MG PO TBDP: 4.0000 mg | ORAL_TABLET | Freq: Three times a day (TID) | ORAL | 0 refills | Status: DC | PRN

## 2020-08-13 MED ORDER — OMEPRAZOLE 20 MG PO CPDR: 20.0000 mg | DELAYED_RELEASE_CAPSULE | Freq: Every day | ORAL | 0 refills | Status: DC

## 2020-08-13 MED ORDER — ALUM & MAG HYDROXIDE-SIMETH 200-200-20 MG/5ML PO SUSP
ORAL | Status: AC
  Filled 2020-08-13: qty 30

## 2020-08-13 MED ORDER — LIDOCAINE VISCOUS HCL 2 % MT SOLN
15.0000 mL | Freq: Once | OROMUCOSAL | Status: AC
  Administered 2020-08-13: 15 mL via ORAL

## 2020-08-13 MED ORDER — ALUM & MAG HYDROXIDE-SIMETH 200-200-20 MG/5ML PO SUSP
30.0000 mL | Freq: Once | ORAL | Status: AC
  Administered 2020-08-13: 30 mL via ORAL

## 2020-08-13 NOTE — ED Triage Notes (Signed)
Pt has GERD pt ate fried chicken and [employer]  yesterday and woke up after mid night with reflux ,vomiting and diarrhea .

## 2020-08-13 NOTE — ED Provider Notes (Signed)
[facility]    CSN: [id number] Arrival date & time: 08/13/20  1527      History   Chief Complaint Chief Complaint  Patient presents with   Emesis   Diarrhea    HPI Nichole Garcia is a 40 y.o. female.   Patient presents today with a several hour history of GI symptoms.  She reports abdominal pain, nausea, vomiting, diarrhea.  Denies any fever, chest pain, shortness of breath, cough, congestion, melena, hematochezia, hematemesis.  Reports symptoms began after drinking beer and eating barbecue and fried chicken.  Reports household sick contacts with similar symptoms.  She does have a history of acid reflux and states current symptoms are similar to previous episodes of this condition.  She has been without her PPI (omeprazole) for approximately 1 week.  Denies history of H. pylori or previous endoscopy.  She denies previous abdominal surgery.  Reports associated upper abdominal pain which is rated 5/6 on a 0-10 pain scale, described as aching, worse with palpation, no alleviating factors identified.  Pain was severe enough that she cannot wear her bra earlier today.  She denies history of pancreatitis and reports that she did not drink an excessive amount but only had 1-2 beers prior to symptom onset.  She is requesting a refill of her acid reflux medication if appropriate today.  Has not seen a GI specialist in the past.   Past Medical History:  Diagnosis Date   Acid reflux    Migraines     There are no problems to display for this patient.   Past Surgical History:  Procedure Laterality Date   TUBAL LIGATION      OB History   No obstetric history on file.      Home Medications    Prior to Admission medications   Medication Sig Start Date End Date Taking? Authorizing Provider  acetaminophen (TYLENOL) 500 MG tablet Take 500 mg by mouth every 6 (six) hours as needed.   Yes [provider]  chlorhexidine (HIBICLENS) 4 % external liquid Apply topically  daily as needed. 07/31/20  Yes Domenick Gong, MD  diphenhydrAMINE (BENADRYL) 25 MG tablet Take 25 mg by mouth at bedtime as needed.   Yes [provider]  ibuprofen (ADVIL) 800 MG tablet Take 1 tablet (800 mg total) by mouth 3 (three) times daily. 01/18/20  Yes Wieters, Hallie C, PA-C  omeprazole (PRILOSEC) 20 MG capsule Take 1 capsule (20 mg total) by mouth daily. 08/13/20  Yes Cornelius Marullo K, PA-C  ondansetron (ZOFRAN ODT) 4 MG disintegrating tablet Take 1 tablet (4 mg total) by mouth every 8 (eight) hours as needed for nausea or vomiting. 08/13/20  Yes Naidelin Gugliotta, Noberto Retort, PA-C    Family History Family History  Problem Relation Age of Onset   Hypertension Mother    Diabetes Mother     Social History Social History   Tobacco Use   Smoking status: Never   Smokeless tobacco: Never  Vaping Use   Vaping Use: Former  Substance Use Topics   Alcohol use: Yes    Comment: occasionally on weekends   Drug use: Yes    Types: Marijuana     Allergies   Bee venom and Asa [aspirin]   Review of Systems Review of Systems  Constitutional:  Positive for activity change and appetite change. Negative for fatigue and fever.  HENT:  Negative for congestion, sinus pressure, sneezing and sore throat.   Respiratory:  Negative for cough and shortness of breath.  Cardiovascular:  Negative for chest pain.  Gastrointestinal:  Positive for abdominal pain, diarrhea, nausea and vomiting. Negative for blood in stool and constipation.  Musculoskeletal:  Negative for arthralgias and myalgias.  Neurological:  Negative for dizziness, light-headedness and headaches.    Physical Exam Triage Vital Signs ED Triage Vitals  Enc Vitals Group     BP 08/13/20 1622 136/89     Pulse Rate 08/13/20 1622 91     Resp 08/13/20 1622 20     Temp 08/13/20 1622 99 F (37.2 C)     Temp src --      SpO2 08/13/20 1622 100 %     Weight --      Height --      Head Circumference --      Peak Flow --      Pain Score  08/13/20 1625 0     Pain Loc --      Pain Edu? --      Excl. in GC? --    No data found.  Updated Vital Signs BP 136/89   Pulse 91   Temp 99 F (37.2 C)   Resp 20   LMP 07/15/2020   SpO2 100%   Visual Acuity Right Eye Distance:   Left Eye Distance:   Bilateral Distance:    Right Eye Near:   Left Eye Near:    Bilateral Near:     Physical Exam Vitals reviewed.  Constitutional:      General: She is awake. She is not in acute distress.    Appearance: Normal appearance. She is normal weight. She is not ill-appearing.     Comments: Very pleasant female appears age in no acute distress  HENT:     Head: Normocephalic and atraumatic.  Cardiovascular:     Rate and Rhythm: Normal rate and regular rhythm.     Heart sounds: Normal heart sounds, S1 normal and S2 normal. No murmur heard. Pulmonary:     Effort: Pulmonary effort is normal.     Breath sounds: Normal breath sounds. No wheezing, rhonchi or rales.     Comments: Clear to auscultation bilaterally Abdominal:     General: Bowel sounds are normal.     Palpations: Abdomen is soft.     Tenderness: There is abdominal tenderness in the right upper quadrant, epigastric area and left upper quadrant. There is no right CVA tenderness, left CVA tenderness, guarding or rebound.     Comments: Mild tenderness palpation throughout upper abdomen.  No evidence of acute abdomen exam.  Psychiatric:        Behavior: Behavior is cooperative.     UC Treatments / Results  Labs (all labs ordered are listed, but only abnormal results are displayed) Labs Reviewed  CBC WITH DIFFERENTIAL/PLATELET  COMPREHENSIVE METABOLIC PANEL  LIPASE, BLOOD    EKG   Radiology No results found.  Procedures Procedures (including critical care time)  Medications Ordered in UC Medications  alum & mag hydroxide-simeth (MAALOX/MYLANTA) 200-200-20 MG/5ML suspension 30 mL (30 mLs Oral Given 08/13/20 1730)    And  lidocaine (XYLOCAINE) 2 % viscous mouth  solution 15 mL (15 mLs Oral Given 08/13/20 1731)    Initial Impression / Assessment and Plan / UC Course  I have reviewed the triage vital signs and the nursing notes.  Pertinent labs & imaging results that were available during my care of the patient were reviewed by me and considered in my medical decision making (see chart for details).  Patient was given GI cocktail with improvement of symptoms in clinic today.  Discussed likely viral etiology given household sick contacts with similar symptoms.  Patient was encouraged to eat a bland diet and avoid spicy/acidic/fatty foods.  CBC, CMP, lipase obtained today-results pending.  She was given Zofran to be used at home with instruction to push fluids to prevent dehydration.  She was restarted on PPI therapy as she does have a history of GERD.  I discussed that we are unable to obtain any imaging of her abdomen if she has worsening abdominal pain she needs to go to the emergency room.  Discussed alarm symptoms that warrant emergent evaluation.  Strict return precautions given to which patient expressed understanding.  Final Clinical Impressions(s) / UC Diagnoses   Final diagnoses:  Gastroenteritis  Nausea vomiting and diarrhea     Discharge Instructions      I suspect that you have a stomach bug.  Please eat a very bland diet and avoid spicy/acidic/fatty foods.  Make sure you are drinking plenty of fluid.  I have called in your omeprazole to help with your acid reflux so please start taking this in the morning on empty stomach as you have previously been prescribed.  Use Zofran up to 3 times a day as needed for nausea symptoms.  If you have any worsening symptoms including recurrent abdominal pain you need to go to the emergency room to consider a CT scan.  We will contact you if your lab work is abnormal.     ED Prescriptions     Medication Sig Dispense Auth. Provider   omeprazole (PRILOSEC) 20 MG capsule Take 1 capsule (20 mg total)  by mouth daily. 30 capsule Cathy Crounse K, PA-C   ondansetron (ZOFRAN ODT) 4 MG disintegrating tablet Take 1 tablet (4 mg total) by mouth every 8 (eight) hours as needed for nausea or vomiting. 20 tablet Tayte Childers, Noberto Retort, PA-C      PDMP not reviewed this encounter.   Jeani Hawking, PA-C 08/13/20 1749

## 2020-08-13 NOTE — Discharge Instructions (Addendum)
I suspect that you have a stomach bug.  Please eat a very bland diet and avoid spicy/acidic/fatty foods.  Make sure you are drinking plenty of fluid.  I have called in your omeprazole to help with your acid reflux so please start taking this in the morning on empty stomach as you have previously been prescribed.  Use Zofran up to 3 times a day as needed for nausea symptoms.  If you have any worsening symptoms including recurrent abdominal pain you need to go to the emergency room to consider a CT scan.  We will contact you if your lab work is abnormal.

## 2020-11-03 NOTE — Progress Notes (Signed)
Subjective:    Nichole Garcia - 40 y.o. female MRN [id number]  Date of birth: 11/07/1980  HPI  Nichole Garcia is to establish care.   Current issues and/or concerns: BLOOD PRESSURE: Adherence with salt restriction (low-salt diet): [x]  Yes  []  No Exercise: not outside of work Home Monitoring?: []  Yes    [x]  No Monitoring Frequency: []  Yes    [x]  No Home BP results range: []  Yes    [x]  No Smoking [x]  Yes, was smoking marijuana but stopped on last week SOB? []  Yes    [x]  No   2. ANXIETY/DEPRESSION: Began when her mother passed away in June 16, 2020. Had a close relationship with her mother. Mother was very supportive of everything even financially. Shortly after mother passed away she lost her job. 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 Reports her mother was very influential for her working in the healthcare field. Was working two jobs but quit one because of scheduling. Prefers to stay busy so that she always has something to do. Currently feeling up and down. Sometimes sad and feeling like in the slums. Wants to sleep a lot. Has 4 children ages 79 years-old to 61 years-old who is her motivation to improve throughout life. Anxious mood: yes  Excessive worrying: yes Irritability: yes  Sweating: no Nausea: no Palpitations:no Hyperventilation: no Panic attacks: sometimes  Depressed mood: yes Insomnia: yes, usually sleeping at no more than 2 hour intervals  Feelings of worthlessness: no  Feelings of guilt: no  Suicidal ideations, homicidal ideations, self-harm: no  Crying spells: feels like crying but tries to hold it in Recent Stressors/Life Changes: yes   Relationship problems: recently ended a relationship because was draining   Family stress: no     Financial stress: no    Job stress: no    Recent death/loss: yes, mother June 16, 2020  3. BUMP: Reports bump at left armpit. Happens frequently. One time occurred underneath breast. Reports when happens goes to the  hospital and has lanced but always returns.   ROS per HPI    Health Maintenance:  Health Maintenance Due  Topic Date Due   COVID-19 Vaccine (1) Never done   HIV Screening  Never done   Hepatitis C Screening  Never done   TETANUS/TDAP  Never done   PAP SMEAR-Modifier  Never done   INFLUENZA VACCINE  Never done     Past Medical History: Patient Active Problem List   Diagnosis Date Noted   Anxiety and depression 11/08/2020    Social History   reports that she has never smoked. She has never used smokeless tobacco. She reports current alcohol use. She reports current drug use. Drug: Marijuana.   Family History  family history includes Diabetes in her mother; Hypertension in her mother.   Medications: reviewed and updated   Objective:   Physical Exam BP (!) 146/96   Pulse 85   Ht 5\' 4"  (1.626 m)   Wt 153 lb 2 oz (69.5 kg)   LMP 10/30/2020 (Exact Date)   SpO2 98%   BMI 26.28 kg/m   Physical Exam HENT:     Head: Normocephalic and atraumatic.  Eyes:     Extraocular Movements: Extraocular movements intact.     Conjunctiva/sclera: Conjunctivae normal.     Pupils: Pupils are equal, round, and reactive to light.  Cardiovascular:     Rate and Rhythm: Normal rate and regular rhythm.     Pulses: Normal pulses.  Heart sounds: Normal heart sounds.  Pulmonary:     Effort: Pulmonary effort is normal.     Breath sounds: Normal breath sounds.  Musculoskeletal:     Cervical back: Normal range of motion and neck supple.  Neurological:     General: No focal deficit present.     Mental Status: She is alert and oriented to person, place, and time.  Psychiatric:        Mood and Affect: Affect is tearful.        Behavior: Behavior normal.       Assessment & Plan:  1. Encounter to establish care: - Patient presents today to establish care.  - Return for annual physical examination, labs, and health maintenance. Arrive fasting meaning having no food for at least 8 hours  prior to appointment. You may have only water or black coffee. Please take scheduled medications as normal.  2. Anxiety and depression: - Patient denies thoughts of self-harm, suicidal ideations, homicidal ideations. - Begin Sertraline as prescribed.  Do not drink alcohol or use illicit substances with with this medication.  Avoid driving or hazardous activity until you know how this medication will affect you. Your reactions could be impaired. Dizziness or fainting can cause falls, accidents, or severe injuries. Common side effects include drowsiness, nausea, constipation, loss of appetite, dry mouth, increased sweating. Call your provider if you have pounding heartbeats or fluttering in your chest, a light-headed feeling like you may pass out, easy bruising/unusal bleeding, vision change, difficult or painful urination, impotence/sexual problems, liver problems (right-sided upper stomach pain, itching, dark urine, yellowing of skin or eyes/jaundice, low levels of sodium in the body (headache, confusion, slurred speech, severe weakness, vomiting, loss of coordination, feeling unsteady), or manic episodes (racing thoughts, increased energy, decreased need for sleep, risk-taking behavior, being agitated, talkative) Seek medical attention immediately if you have symptoms of serotonin syndrome such as agitation, hallucinations, fever, sweating, shivering, fast heart rate, muscle stiffness, twitching, loss of coordination, nausea, vomiting, or diarrhea Report any new or worsening symptoms to your provider, such as but not limited to: mood or behavior changes, anxiety, panic attacks, trouble sleeping, or if you feel impulsive, irritable, agitated, hostile, aggressive, restless, hyperactive (mentally or physically), more depressed, or have thoughts about suicide or hurting yourself - Referral to Norton Blizzard, LCSW for counseling services and community resources. - Patient declined referral to Psychiatry as of  present. - Follow-up with primary provider in 4 weeks or sooner if needed.  - sertraline (ZOLOFT) 25 MG tablet; Take 1 tablet (25 mg total) by mouth daily.  Dispense: 30 tablet; Refill: 0  3. Recurrent boils: - Referral to Dermatology for further evaluation and management.  - Ambulatory referral to Dermatology  4. Elevated blood pressure reading without diagnosis of hypertension: - Blood pressure not at goal during today's visit. Patient asymptomatic without chest pressure, chest pain, palpitations, shortness of breath, worst headache of life, and any additional red flag symptoms. - Counseled on normal blood pressure reading. - Follow-up with primary provider in 2 to 4 weeks or sooner if needed.      Patient was given clear instructions to go to Emergency Department or return to medical center if symptoms don't improve, worsen, or new problems develop.The patient verbalized understanding.  I discussed the assessment and treatment plan with the patient. The patient was provided an opportunity to ask questions and all were answered. The patient agreed with the plan and demonstrated an understanding of the instructions.   The patient  was advised to call back or seek an in-person evaluation if the symptoms worsen or if the condition fails to improve as anticipated.    Ricky Stabs, NP 11/09/2020, 12:47 PM Primary Care at [facility]

## 2020-11-08 ENCOUNTER — Encounter: Payer: Self-pay | Admitting: Family

## 2020-11-08 ENCOUNTER — Other Ambulatory Visit: Payer: Self-pay

## 2020-11-08 ENCOUNTER — Ambulatory Visit (INDEPENDENT_AMBULATORY_CARE_PROVIDER_SITE_OTHER): Payer: 59 | Admitting: Family

## 2020-11-08 VITALS — BP 146/96 | HR 85 | Ht 64.0 in | Wt 153.1 lb

## 2020-11-08 DIAGNOSIS — F32A Depression, unspecified: Secondary | ICD-10-CM

## 2020-11-08 DIAGNOSIS — R030 Elevated blood-pressure reading, without diagnosis of hypertension: Secondary | ICD-10-CM | POA: Diagnosis not present

## 2020-11-08 DIAGNOSIS — F419 Anxiety disorder, unspecified: Secondary | ICD-10-CM | POA: Diagnosis not present

## 2020-11-08 DIAGNOSIS — L0293 Carbuncle, unspecified: Secondary | ICD-10-CM

## 2020-11-08 DIAGNOSIS — R69 Illness, unspecified: Secondary | ICD-10-CM | POA: Diagnosis not present

## 2020-11-08 DIAGNOSIS — Z7689 Persons encountering health services in other specified circumstances: Secondary | ICD-10-CM

## 2020-11-08 DIAGNOSIS — L0292 Furuncle, unspecified: Secondary | ICD-10-CM

## 2020-11-08 MED ORDER — SERTRALINE HCL 25 MG PO TABS: 25.0000 mg | ORAL_TABLET | Freq: Every day | ORAL | 0 refills | Status: DC

## 2020-11-08 NOTE — Patient Instructions (Signed)
Thank you for choosing Primary Care at [facility] for your medical home!    Nichole Garcia was seen by [name], NP today.   Nichole Garcia's primary care provider is [name], NP.   For the best care possible,  you should try to see Nichole Stabs, NP whenever you come to clinic.   We look forward to seeing you again soon!  If you have any questions about your visit today,  please call us at [phone]  Or feel free to reach your provider via MyChart.    Keeping you healthy   Get these tests Blood pressure- Have your blood pressure checked once a year by your healthcare provider.  Normal blood pressure is 120/80. Weight- Have your body mass index (BMI) calculated to screen for obesity.  BMI is a measure of body fat based on height and weight. You can also calculate your own BMI at https://www.west-esparza.com/. Cholesterol- Have your cholesterol checked regularly starting at age 5, sooner may be necessary if you have diabetes, high blood pressure, if a family member developed heart diseases at an early age or if you smoke.  Chlamydia, HIV, and other sexual transmitted disease- Get screened each year until the age of 68 then within three months of each new sexual partner. Diabetes- Have your blood sugar checked regularly if you have high blood pressure, high cholesterol, a family history of diabetes or if you are overweight.   Get these vaccines Flu shot- Every fall. Tetanus shot- Every 10 years. Menactra- Single dose; prevents meningitis.   Take these steps Don't smoke- If you do smoke, ask your healthcare provider about quitting. For tips on how to quit, go to www.smokefree.gov or call 1-800-QUIT-NOW. Be physically active- Exercise 5 days a week for at least 30 minutes.  If you are not already physically active start slow and gradually work up to 30 minutes of moderate physical activity.  Examples of moderate activity include walking briskly, mowing the yard, dancing,  swimming bicycling, etc. Eat a healthy diet- Eat a variety of healthy foods such as fruits, vegetables, low fat milk, low fat cheese, yogurt, lean meats, poultry, fish, beans, tofu, etc.  For more information on healthy eating, go to www.thenutritionsource.org Drink alcohol in moderation- Limit alcohol intake two drinks or less a day.  Never drink and drive. Dentist- Brush and floss teeth twice daily; visit your dentis twice a year. Depression-Your emotional health is as important as your physical health.  If you're feeling down, losing interest in things you normally enjoy please talk with your healthcare provider. Gun Safety- If you keep a gun in your home, keep it unloaded and with the safety lock on.  Bullets should be stored separately. Helmet use- Always wear a helmet when riding a motorcycle, bicycle, rollerblading or skateboarding. Safe sex- If you may be exposed to a sexually transmitted infection, use a condom Seat belts- Seat bels can save your life; always wear one. Smoke/Carbon Monoxide detectors- These detectors need to be installed on the appropriate level of your home.  Replace batteries at least once a year. Skin Cancer- When out in the sun, cover up and use sunscreen SPF 15 or higher. Violence- If anyone is threatening or hurting you, please tell your healthcare provider.

## 2020-11-08 NOTE — Progress Notes (Signed)
Bump under the left arm. Concerns about anxiety

## 2020-11-09 ENCOUNTER — Telehealth: Payer: Self-pay | Admitting: Family

## 2020-11-19 ENCOUNTER — Emergency Department (HOSPITAL_COMMUNITY)
Admission: EM | Admit: 2020-11-19 | Discharge: 2020-11-19 | Disposition: A | Discharge status: Home / Self Care | Payer: 59 | Attending: Emergency Medicine | Admitting: Emergency Medicine

## 2020-11-19 ENCOUNTER — Other Ambulatory Visit: Payer: Self-pay

## 2020-11-19 ENCOUNTER — Encounter (HOSPITAL_COMMUNITY): Payer: Self-pay | Admitting: Emergency Medicine

## 2020-11-19 DIAGNOSIS — M549 Dorsalgia, unspecified: Secondary | ICD-10-CM | POA: Diagnosis not present

## 2020-11-19 DIAGNOSIS — J020 Streptococcal pharyngitis: Secondary | ICD-10-CM

## 2020-11-19 DIAGNOSIS — Z20822 Contact with and (suspected) exposure to covid-19: Secondary | ICD-10-CM | POA: Insufficient documentation

## 2020-11-19 DIAGNOSIS — J029 Acute pharyngitis, unspecified: Secondary | ICD-10-CM | POA: Diagnosis not present

## 2020-11-19 DIAGNOSIS — R000 Tachycardia, unspecified: Secondary | ICD-10-CM | POA: Diagnosis not present

## 2020-11-19 LAB — RESP PANEL BY RT-PCR (FLU A&B, COVID) ARPGX2
Influenza A by PCR: NEGATIVE
Influenza B by PCR: NEGATIVE
SARS Coronavirus 2 by RT PCR: NEGATIVE

## 2020-11-19 LAB — GROUP A STREP BY PCR: Group A Strep by PCR: DETECTED — AB

## 2020-11-19 MED ORDER — PENICILLIN G BENZATHINE 1200000 UNIT/2ML IM SUSY
1.2000 10*6.[IU] | PREFILLED_SYRINGE | Freq: Once | INTRAMUSCULAR | Status: AC
  Administered 2020-11-19: 1.2 10*6.[IU] via INTRAMUSCULAR
  Filled 2020-11-19: qty 2

## 2020-11-19 MED ORDER — IBUPROFEN 200 MG PO TABS
600.0000 mg | ORAL_TABLET | Freq: Once | ORAL | Status: AC
  Administered 2020-11-19: 600 mg via ORAL
  Filled 2020-11-19: qty 3

## 2020-11-19 NOTE — ED Triage Notes (Signed)
Patient presents due to fever, throat, back and ear aches. She states she has been sweating and complains of body aches.

## 2020-11-19 NOTE — ED Provider Notes (Signed)
[facility]-EMERGENCY DEPT Provider Note   CSN: [id number] Arrival date & time: 11/19/20  1818     History Chief Complaint  Patient presents with   Sore Throat   Generalized Body Aches   Back Pain   Fever   Otalgia     Nichole Garcia is a 40 y.o. female.  40 year old female presents with complaint of headache, body aches, cough and sore throat.  Symptoms started suddenly yesterday while she was at work.  Patient is alternating Motrin and Tylenol as well as taking her vitamins without resolution of her symptoms.  No known sick contacts.  Patient is otherwise healthy, has not had a flu shot.      Past Medical History:  Diagnosis Date   Acid reflux    Migraines     Patient Active Problem List   Diagnosis Date Noted   Anxiety and depression 11/08/2020    Past Surgical History:  Procedure Laterality Date   TUBAL LIGATION       OB History   No obstetric history on file.     Family History  Problem Relation Age of Onset   Hypertension Mother    Diabetes Mother     Social History   Tobacco Use   Smoking status: Never   Smokeless tobacco: Never  Vaping Use   Vaping Use: Former  Substance Use Topics   Alcohol use: Yes    Comment: occasionally on weekends   Drug use: Yes    Types: Marijuana    Home Medications Prior to Admission medications   Medication Sig Start Date End Date Taking? Authorizing Provider  acetaminophen (TYLENOL) 500 MG tablet Take 500 mg by mouth every 6 (six) hours as needed. Patient not taking: Reported on 11/08/2020    [provider]  chlorhexidine (HIBICLENS) 4 % external liquid Apply topically daily as needed. Patient not taking: Reported on 11/08/2020 07/31/20   Domenick Gong, MD  diphenhydrAMINE (BENADRYL) 25 MG tablet Take 25 mg by mouth at bedtime as needed.    [provider]  ibuprofen (ADVIL) 800 MG tablet Take 1 tablet (800 mg total) by mouth 3 (three) times daily. Patient not  taking: Reported on 11/08/2020 01/18/20   Wieters, Fran Lowes C, PA-C  omeprazole (PRILOSEC) 20 MG capsule Take 1 capsule (20 mg total) by mouth daily. 08/13/20   Raspet, Erin K, PA-C  ondansetron (ZOFRAN ODT) 4 MG disintegrating tablet Take 1 tablet (4 mg total) by mouth every 8 (eight) hours as needed for nausea or vomiting. Patient not taking: Reported on 11/08/2020 08/13/20   Raspet, Denny Peon K, PA-C  sertraline (ZOLOFT) 25 MG tablet Take 1 tablet (25 mg total) by mouth daily. 11/08/20   [name], NP    Allergies    Bee venom and Asa [aspirin]  Review of Systems   Review of Systems  Constitutional:  Positive for diaphoresis and fever. Negative for chills.  HENT:  Positive for sore throat.   Respiratory:  Positive for cough.   Gastrointestinal:  Negative for nausea and vomiting.  Musculoskeletal:  Positive for arthralgias and myalgias.  Skin:  Negative for rash.  Allergic/Immunologic: Negative for immunocompromised state.  Neurological:  Positive for headaches. Negative for weakness.  Hematological:  Negative for adenopathy.  Psychiatric/Behavioral:  Negative for confusion.   All other systems reviewed and are negative.  Physical Exam Updated Vital Signs BP (!) 155/110 (BP Location: Right Arm)   Pulse (!) 116   Temp (!) 102.5 F (39.2 C) (Oral)  Resp 20   LMP 10/30/2020 (Exact Date)   SpO2 100%   Physical Exam Vitals and nursing note reviewed.  Constitutional:      General: She is not in acute distress.    Appearance: She is well-developed. She is not diaphoretic.  HENT:     Head: Normocephalic and atraumatic.     Right Ear: Tympanic membrane and ear canal normal.     Left Ear: Tympanic membrane and ear canal normal.     Nose: Congestion present.     Mouth/Throat:     Pharynx: Posterior oropharyngeal erythema present. No oropharyngeal exudate.  Eyes:     Conjunctiva/sclera: Conjunctivae normal.  Cardiovascular:     Rate and Rhythm: Regular rhythm. Tachycardia present.      Heart sounds: Normal heart sounds.  Pulmonary:     Effort: Pulmonary effort is normal.     Breath sounds: Normal breath sounds.  Musculoskeletal:     Cervical back: Neck supple.  Lymphadenopathy:     Cervical: No cervical adenopathy.  Skin:    General: Skin is warm and dry.     Findings: No erythema or rash.  Neurological:     Mental Status: She is alert and oriented to person, place, and time.  Psychiatric:        Behavior: Behavior normal.    ED Results / Procedures / Treatments   Labs (all labs ordered are listed, but only abnormal results are displayed) Labs Reviewed  GROUP A STREP BY PCR - Abnormal; Notable for the following components:      Result Value   Group A Strep by PCR DETECTED (*)    All other components within normal limits  RESP PANEL BY RT-PCR (FLU A&B, COVID) ARPGX2    EKG None  Radiology No results found.  Procedures Procedures   Medications Ordered in ED Medications  ibuprofen (ADVIL) tablet 600 mg (has no administration in time range)  penicillin g benzathine (BICILLIN LA) 1200000 UNIT/2ML injection 1.2 Million Units (has no administration in time range)    ED Course  I have reviewed the triage vital signs and the nursing notes.  Pertinent labs & imaging results that were available during my care of the patient were reviewed by me and considered in my medical decision making (see chart for details).  Clinical Course as of 11/19/20 2014  Sat Nov 19, 2020  8632 40 year old female presents with complaint of body aches with sore throat, fever, cough.  Symptoms onset yesterday with sudden onset.  Patient arrives in the ED, febrile with temperature of 102.5.  Found to have congestion of the oropharyngeal erythema without exudate or tender cervical lymphadenopathy.  Lungs are clear to auscultation.  Room air O2 sat 100%, reassuring.  Patient is negative for COVID and the flu, she is positive for strep.  Discussed treatment options including IM  penicillin versus oral therapy, patient requests IM penicillin tonight.  Patient was given ibuprofen for her fever. Recommend recheck with PCP as needed, return to ED for worsening or concerning symptoms. [LM]    Clinical Course User Index [LM] Alden Hipp   MDM Rules/Calculators/A&P                           Final Clinical Impression(s) / ED Diagnoses Final diagnoses:  Strep pharyngitis    Rx / DC Orders ED Discharge Orders     None        Jeannie Fend, PA-C  11/19/20 2014    Derwood Kaplan, MD 11/20/20 1349

## 2020-11-19 NOTE — Discharge Instructions (Signed)
Motrin and Tylenol as needed as directed.  Gargle warm salt water, drink warm tea with honey.  Recheck with your doctor as needed.  Return to the emergency room for worsening or concerning symptoms.

## 2020-11-24 NOTE — Progress Notes (Signed)
Patient ID: Nichole Garcia, female    DOB: Apr 10, 1980  MRN: [id number]  CC: Anxiety Depression Follow-Up   Subjective: Nichole Garcia is a 40 y.o. female who presents for anxiety depression follow-up.    Her concerns today include:   ANXIETY DEPRESSION FOLLOW-UP: 11/08/2020: - Begin Sertraline as prescribed.   11/28/2020: Reports she quit taking Sertraline because it caused headaches, would like to try new medication. Feels anxiety depression has improved some since last visit especially with the holidays approaching.   2. BLOOD PRESSURE CHECK: She is monitoring what she eats. Checking blood pressure at home sometimes. Not ready to try blood pressure medication just yet. Prefers to continue with diet and lifestyle changes for now.  Depression screen Nichole Garcia 2/9 11/28/2020 11/08/2020  Decreased Interest 0 0  Down, Depressed, Hopeless 0 3  PHQ - 2 Score 0 3  Altered sleeping 1 3  Tired, decreased energy 1 3  Change in appetite 0 3  Feeling bad or failure about yourself  1 2  Trouble concentrating - 2  Moving slowly or fidgety/restless 0 2  Suicidal thoughts 0 0  PHQ-9 Score 3 18  Difficult doing work/chores Not difficult at all Somewhat difficult     Patient Active Problem List   Diagnosis Date Noted   Chondromalacia patellae 11/28/2020   Internal derangement of right knee 11/28/2020   Anxiety and depression 11/08/2020     Current Outpatient Medications on File Prior to Visit  Medication Sig Dispense Refill   diphenhydrAMINE (BENADRYL) 25 MG tablet Take 25 mg by mouth at bedtime as needed.     omeprazole (PRILOSEC) 20 MG capsule Take 1 capsule (20 mg total) by mouth daily. 30 capsule 0   No current facility-administered medications on file prior to visit.    Allergies  Allergen Reactions   Bee Venom Shortness Of Breath and Swelling   Asa [Aspirin] Other (See Comments)    "Makes me feel funny"    Social History   Socioeconomic History   Marital status: Single     Spouse name: Not on file   Number of children: Not on file   Years of education: Not on file   Highest education level: Not on file  Occupational History   Not on file  Tobacco Use   Smoking status: Never   Smokeless tobacco: Never  Vaping Use   Vaping Use: Former  Substance and Sexual Activity   Alcohol use: Yes    Comment: occasionally on weekends   Drug use: Yes    Types: Marijuana   Sexual activity: Not on file  Other Topics Concern   Not on file  Social History Narrative   Not on file   Social Determinants of Health   Financial Resource Strain: Not on file  Food Insecurity: Not on file  Transportation Needs: Not on file  Physical Activity: Not on file  Stress: Not on file  Social Connections: Not on file  Intimate Partner Violence: Not on file    Family History  Problem Relation Age of Onset   Hypertension Mother    Diabetes Mother     Past Surgical History:  Procedure Laterality Date   TUBAL LIGATION      ROS: Review of Systems Negative except as stated above  PHYSICAL EXAM: BP (!) 135/92 (BP Location: Left Arm, Patient Position: Sitting, Cuff Size: Normal)   Pulse 83   Temp 98.3 F (36.8 C)   Resp 18   Ht 5' 4.02" (1.626 m)  Wt 160 lb (72.6 kg)   LMP 10/30/2020 (Exact Date)   SpO2 97%   BMI 27.45 kg/m   Physical Exam Constitutional:      Appearance: Normal appearance.  HENT:     Head: Normocephalic and atraumatic.  Eyes:     Extraocular Movements: Extraocular movements intact.     Conjunctiva/sclera: Conjunctivae normal.     Pupils: Pupils are equal, round, and reactive to light.  Cardiovascular:     Rate and Rhythm: Normal rate and regular rhythm.     Pulses: Normal pulses.     Heart sounds: Normal heart sounds.  Pulmonary:     Effort: Pulmonary effort is normal.     Breath sounds: Normal breath sounds.  Musculoskeletal:     Cervical back: Normal range of motion and neck supple.  Neurological:     General: No focal deficit  present.     Mental Status: She is alert and oriented to person, place, and time.  Psychiatric:        Mood and Affect: Mood normal.        Behavior: Behavior normal.    ASSESSMENT AND PLAN: 1. Anxiety and depression: - Patient denies thoughts of self-harm, suicidal ideations, homicidal ideations. - Sertraline discontinued related to side effects of headaches.  - Begin Escitalopram as prescribed. Discussed medication compliance and adverse effects.  - Follow-up with primary provider in 4 weeks or sooner if needed.  - escitalopram (LEXAPRO) 10 MG tablet; Take 1 tablet (10 mg total) by mouth daily.  Dispense: 30 tablet; Refill: 0  2. Essential hypertension: - Newly diagnosed.  - Patient declined pharmacological therapy as of present. - Counseled on blood pressure goal of less than 130/80, low-sodium, DASH diet, and 150 minutes of moderate intensity exercise per week as tolerated. Write down your blood pressure readings each day and bring those results along with your home blood pressure monitor to your next appointment.    Patient was given the opportunity to ask questions.  Patient verbalized understanding of the plan and was able to repeat key elements of the plan. Patient was given clear instructions to go to Emergency Department or return to medical center if symptoms don't improve, worsen, or new problems develop.The patient verbalized understanding.    Requested Prescriptions   Signed Prescriptions Disp Refills   escitalopram (LEXAPRO) 10 MG tablet 30 tablet 0    Sig: Take 1 tablet (10 mg total) by mouth daily.    Return in about 4 weeks (around 12/26/2020) for Follow-Up or next available anxiety depression .  [name], NP

## 2020-11-28 ENCOUNTER — Ambulatory Visit (INDEPENDENT_AMBULATORY_CARE_PROVIDER_SITE_OTHER): Payer: 59 | Admitting: Family

## 2020-11-28 ENCOUNTER — Ambulatory Visit: Payer: 59 | Admitting: Family

## 2020-11-28 ENCOUNTER — Other Ambulatory Visit: Payer: Self-pay

## 2020-11-28 VITALS — BP 135/92 | HR 83 | Temp 98.3°F | Resp 18 | Ht 64.02 in | Wt 160.0 lb

## 2020-11-28 DIAGNOSIS — F419 Anxiety disorder, unspecified: Secondary | ICD-10-CM

## 2020-11-28 DIAGNOSIS — M2391 Unspecified internal derangement of right knee: Secondary | ICD-10-CM | POA: Insufficient documentation

## 2020-11-28 DIAGNOSIS — R69 Illness, unspecified: Secondary | ICD-10-CM | POA: Diagnosis not present

## 2020-11-28 DIAGNOSIS — I10 Essential (primary) hypertension: Secondary | ICD-10-CM | POA: Insufficient documentation

## 2020-11-28 DIAGNOSIS — M2240 Chondromalacia patellae, unspecified knee: Secondary | ICD-10-CM | POA: Insufficient documentation

## 2020-11-28 DIAGNOSIS — F32A Depression, unspecified: Secondary | ICD-10-CM | POA: Diagnosis not present

## 2020-11-28 MED ORDER — ESCITALOPRAM OXALATE 10 MG PO TABS
10.0000 mg | ORAL_TABLET | Freq: Every day | ORAL | 0 refills | Status: DC
Start: 2020-11-28 — End: 2020-12-29

## 2020-11-28 NOTE — Progress Notes (Signed)
Pt presents for hypertension and anxiety follow-up

## 2020-12-26 NOTE — Progress Notes (Signed)
Patient ID: Nichole Garcia, female    DOB: November 02, 1980  MRN: [id number]  CC: Annual Physical Exam   Subjective: Nichole Garcia is a 40 y.o. female who presents for annual physical exam.   Her concerns today include:   HYPERTENSION FOLLOW-UP: 11/28/2020: - Newly diagnosed.  - Patient declined pharmacological therapy as of present.  12/29/2020: Has improved diet using less salt and seasonings.   2. ANXIETY DEPRESSION FOLLOW-UP: 11/28/2020: - Sertraline discontinued related to side effects of headaches.  - Begin Escitalopram as prescribed. Discussed medication compliance and adverse effects.   12/29/2020: Doing well on current regimen but reports having some increased stress maybe related to the holidays. Overall reports feeling better.  Depression screen [facility] 2/9 12/29/2020 11/28/2020 11/08/2020  Decreased Interest 0 0 0  Down, Depressed, Hopeless 1 0 3  PHQ - 2 Score 1 0 3  Altered sleeping 1 1 3   Tired, decreased energy 0 1 3  Change in appetite 0 0 3  Feeling bad or failure about yourself  0 1 2  Trouble concentrating 0 - 2  Moving slowly or fidgety/restless 0 0 2  Suicidal thoughts 0 0 0  PHQ-9 Score 2 3 18   Difficult doing work/chores Not difficult at all Not difficult at all Somewhat difficult     Patient Active Problem List   Diagnosis Date Noted   Chondromalacia patellae 11/28/2020   Internal derangement of right knee 11/28/2020   Essential hypertension 11/28/2020   Anxiety and depression 11/08/2020     Current Outpatient Medications on File Prior to Visit  Medication Sig Dispense Refill   diphenhydrAMINE (BENADRYL) 25 MG tablet Take 25 mg by mouth at bedtime as needed.     omeprazole (PRILOSEC) 20 MG capsule Take 1 capsule (20 mg total) by mouth daily. 30 capsule 0   No current facility-administered medications on file prior to visit.    Allergies  Allergen Reactions   Bee Venom Shortness Of Breath and Swelling   Asa [Aspirin] Other (See Comments)     "Makes me feel funny"    Social History   Socioeconomic History   Marital status: Single    Spouse name: Not on file   Number of children: Not on file   Years of education: Not on file   Highest education level: Not on file  Occupational History   Not on file  Tobacco Use   Smoking status: Never   Smokeless tobacco: Never  Vaping Use   Vaping Use: Former  Substance and Sexual Activity   Alcohol use: Yes    Comment: occasionally on weekends   Drug use: Yes    Types: Marijuana   Sexual activity: Not on file  Other Topics Concern   Not on file  Social History Narrative   Not on file   Social Determinants of Health   Financial Resource Strain: Not on file  Food Insecurity: Not on file  Transportation Needs: Not on file  Physical Activity: Not on file  Stress: Not on file  Social Connections: Not on file  Intimate Partner Violence: Not on file    Family History  Problem Relation Age of Onset   Hypertension Mother    Diabetes Mother     Past Surgical History:  Procedure Laterality Date   TUBAL LIGATION      ROS: Review of Systems Negative except as stated above  PHYSICAL EXAM: BP 134/88 (BP Location: Left Arm, Patient Position: Sitting, Cuff Size: Normal)   Pulse 88  Temp 98.3 F (36.8 C)   Resp 18   Ht 5' 4.02" (1.626 m)   Wt 162 lb 9.6 oz (73.8 kg)   SpO2 96%   BMI 27.90 kg/m   Physical Exam HENT:     Head: Normocephalic and atraumatic.     Right Ear: Tympanic membrane, ear canal and external ear normal.     Left Ear: Tympanic membrane, ear canal and external ear normal.     Nose: Nose normal.     Mouth/Throat:     Mouth: Mucous membranes are moist.     Pharynx: Oropharynx is clear.  Eyes:     Extraocular Movements: Extraocular movements intact.     Conjunctiva/sclera: Conjunctivae normal.     Pupils: Pupils are equal, round, and reactive to light.  Cardiovascular:     Rate and Rhythm: Normal rate and regular rhythm.     Pulses:  Normal pulses.     Heart sounds: Normal heart sounds.  Pulmonary:     Effort: Pulmonary effort is normal.     Breath sounds: Normal breath sounds.  Chest:     Comments: Patient declined exam. Abdominal:     General: Bowel sounds are normal.     Palpations: Abdomen is soft.  Genitourinary:    Comments: Patient declined exam.  Musculoskeletal:        General: Normal range of motion.     Cervical back: Normal range of motion and neck supple.  Skin:    General: Skin is warm and dry.     Capillary Refill: Capillary refill takes less than 2 seconds.     Comments: Reports abscess of right armpit recently spontaneously  ruptured and placed a Band-Aid on it. Has Dermatology appointment soon.  Neurological:     General: No focal deficit present.     Mental Status: She is alert and oriented to person, place, and time.  Psychiatric:        Mood and Affect: Mood normal.        Behavior: Behavior normal.   ASSESSMENT AND PLAN: 1. Annual physical exam: - Counseled on 150 minutes of exercise per week as tolerated, healthy eating (including decreased daily intake of saturated fats, cholesterol, added sugars, sodium), STI prevention, and routine healthcare maintenance.  2. Screening for metabolic disorder: - CMP last obtained 08/13/2020.  3. Screening for deficiency anemia: - CBC last obtained 08/13/2020.  4. Diabetes mellitus screening: - Hemoglobin A1c to screen for pre-diabetes/diabetes. - Hemoglobin A1c  5. Screening cholesterol level: - Lipid panel to screen for high cholesterol.  - Lipid panel  6. Thyroid disorder screen: - TSH to check thyroid function.  - TSH  7. Pap smear for cervical cancer screening: 8. Routine screening for STI (sexually transmitted infection: - Patient declined.   9. Encounter for screening for HIV: - HIV antibody to screen for human immunodeficiency virus.  - HIV antibody (with reflex)  10. Need for hepatitis C screening test: - Hepatitis C antibody  to screen for hepatitis C.  - Hepatitis C Antibody  11. Essential hypertension: - Blood pressure improved since previous appointment. Will continue without pharmacological therapy as of present.  - Follow-up with primary provider in 3 months or sooner if needed.   12. Anxiety and depression: - Patient denies thoughts of self-harm, suicidal ideations, homicidal ideations. - Increase Escitalopram from 10 mg daily to 20 mg daily. Counseled on medication compliance and adverse effects.  - Follow-up with primary provider in 4 weeks or sooner if needed.  -  escitalopram (LEXAPRO) 20 MG tablet; Take 1 tablet (20 mg total) by mouth daily.  Dispense: 90 tablet; Refill: 0  13. Recurrent boils: - Keep scheduled appointment with Dermatology.   14. Need for Tdap vaccination: - Administered today in office.  - Tdap vaccine greater than or equal to 7yo IM   Patient was given the opportunity to ask questions.  Patient verbalized understanding of the plan and was able to repeat key elements of the plan. Patient was given clear instructions to go to Emergency Department or return to medical center if symptoms don't improve, worsen, or new problems develop.The patient verbalized understanding.   Orders Placed This Encounter  Procedures   Tdap vaccine greater than or equal to 7yo IM   HIV antibody (with reflex)   Hepatitis C Antibody   Lipid panel   TSH   Hemoglobin A1c    Requested Prescriptions   Signed Prescriptions Disp Refills   escitalopram (LEXAPRO) 20 MG tablet 90 tablet 0    Sig: Take 1 tablet (20 mg total) by mouth daily.    Return in about 1 year (around 12/29/2021) for Physical per patient preference and anxiety depression 4 weeks .  [name], NP

## 2020-12-29 ENCOUNTER — Encounter: Payer: Self-pay | Admitting: Family

## 2020-12-29 ENCOUNTER — Other Ambulatory Visit: Payer: Self-pay

## 2020-12-29 ENCOUNTER — Ambulatory Visit (INDEPENDENT_AMBULATORY_CARE_PROVIDER_SITE_OTHER): Payer: 59 | Admitting: Family

## 2020-12-29 VITALS — BP 134/88 | HR 88 | Temp 98.3°F | Resp 18 | Ht 64.02 in | Wt 162.6 lb

## 2020-12-29 DIAGNOSIS — L0293 Carbuncle, unspecified: Secondary | ICD-10-CM

## 2020-12-29 DIAGNOSIS — Z131 Encounter for screening for diabetes mellitus: Secondary | ICD-10-CM

## 2020-12-29 DIAGNOSIS — R69 Illness, unspecified: Secondary | ICD-10-CM | POA: Diagnosis not present

## 2020-12-29 DIAGNOSIS — F419 Anxiety disorder, unspecified: Secondary | ICD-10-CM | POA: Diagnosis not present

## 2020-12-29 DIAGNOSIS — Z0000 Encounter for general adult medical examination without abnormal findings: Secondary | ICD-10-CM

## 2020-12-29 DIAGNOSIS — F32A Depression, unspecified: Secondary | ICD-10-CM | POA: Diagnosis not present

## 2020-12-29 DIAGNOSIS — Z1329 Encounter for screening for other suspected endocrine disorder: Secondary | ICD-10-CM | POA: Diagnosis not present

## 2020-12-29 DIAGNOSIS — Z13220 Encounter for screening for lipoid disorders: Secondary | ICD-10-CM

## 2020-12-29 DIAGNOSIS — Z113 Encounter for screening for infections with a predominantly sexual mode of transmission: Secondary | ICD-10-CM

## 2020-12-29 DIAGNOSIS — Z130 Encounter for screening for diseases of the blood and blood-forming organs and certain disorders involving the immune mechanism: Secondary | ICD-10-CM

## 2020-12-29 DIAGNOSIS — Z1159 Encounter for screening for other viral diseases: Secondary | ICD-10-CM | POA: Diagnosis not present

## 2020-12-29 DIAGNOSIS — Z0001 Encounter for general adult medical examination with abnormal findings: Secondary | ICD-10-CM

## 2020-12-29 DIAGNOSIS — Z13228 Encounter for screening for other metabolic disorders: Secondary | ICD-10-CM

## 2020-12-29 DIAGNOSIS — I10 Essential (primary) hypertension: Secondary | ICD-10-CM

## 2020-12-29 DIAGNOSIS — Z23 Encounter for immunization: Secondary | ICD-10-CM

## 2020-12-29 DIAGNOSIS — Z124 Encounter for screening for malignant neoplasm of cervix: Secondary | ICD-10-CM

## 2020-12-29 DIAGNOSIS — Z114 Encounter for screening for human immunodeficiency virus [HIV]: Secondary | ICD-10-CM

## 2020-12-29 MED ORDER — ESCITALOPRAM OXALATE 20 MG PO TABS: 20.0000 mg | ORAL_TABLET | Freq: Every day | ORAL | 0 refills | Status: DC

## 2020-12-29 NOTE — Patient Instructions (Signed)

## 2020-12-29 NOTE — Progress Notes (Signed)
Pt presents for annual physical exam declined pap menstrual cycle started will return in January to complete

## 2020-12-30 ENCOUNTER — Encounter: Payer: Self-pay | Admitting: Family

## 2020-12-30 DIAGNOSIS — R7303 Prediabetes: Secondary | ICD-10-CM | POA: Insufficient documentation

## 2020-12-30 LAB — LIPID PANEL
Chol/HDL Ratio: 3.6 ratio (ref 0.0–4.4)
Cholesterol, Total: 146 mg/dL (ref 100–199)
HDL: 41 mg/dL (ref >39)
LDL Chol Calc (NIH): 97 mg/dL (ref 0–99)
Triglycerides: 34 mg/dL (ref 0–149)
VLDL Cholesterol Cal: 8 mg/dL (ref 5–40)

## 2020-12-30 LAB — TSH: TSH: 0.436 u[IU]/mL — ABNORMAL LOW (ref 0.450–4.500)

## 2020-12-30 LAB — HEMOGLOBIN A1C
Est. average glucose Bld gHb Est-mCnc: 117 mg/dL
Hgb A1c MFr Bld: 5.7 % — ABNORMAL HIGH (ref 4.8–5.6)

## 2020-12-30 LAB — HEPATITIS C ANTIBODY: Hep C Virus Ab: <0.1 s/co ratio (ref 0.0–0.9)

## 2020-12-30 LAB — HIV ANTIBODY (ROUTINE TESTING W REFLEX): HIV Screen 4th Generation wRfx: NONREACTIVE

## 2020-12-30 NOTE — Progress Notes (Signed)
Cholesterol normal.   Hepatitis C negative.   HIV negative.   Thyroid function lower than normal. Please schedule a repeat thyroid lab in 4 to 6 weeks at lab only appointment.   Hemoglobin A1c is consistent with pre-diabetes. Practice healthy eating habits of fresh fruit and vegetables, lean baked meats such as chicken, fish, and [place]; limit breads, rice, pastas, and desserts; practice regular aerobic exercise (at least 150 minutes a week as tolerated). No medication needed as of present. Encouraged to recheck pre-diabetes in 6 months.

## 2021-01-12 NOTE — Progress Notes (Signed)
Patient ID: Nichole Garcia, female    DOB: Mar 28, 1980  MRN: [id number]  CC: PAP Smear   Subjective: Nichole Garcia is a 40 y.o. female who presents for PAP smear.   Her concerns today include:   ANXIETY DEPRESSION FOLLOW-UP: 12/29/2020: - Increase Escitalopram from 10 mg daily to 20 mg daily.   01/13/2021: Doing well on current regimen, no issues/concerns.   Depression screen [facility] 2/9 01/19/2021 12/29/2020 11/28/2020 11/08/2020  Decreased Interest 0 0 0 0  Down, Depressed, Hopeless 1 1 0 3  PHQ - 2 Score 1 1 0 3  Altered sleeping 1 1 1 3   Tired, decreased energy 0 0 1 3  Change in appetite 0 0 0 3  Feeling bad or failure about yourself  0 0 1 2  Trouble concentrating 0 0 - 2  Moving slowly or fidgety/restless 0 0 0 2  Suicidal thoughts 0 0 0 0  PHQ-9 Score 2 2 3 18   Difficult doing work/chores Not difficult at all Not difficult at all Not difficult at all Somewhat difficult     Patient Active Problem List   Diagnosis Date Noted   Prediabetes 12/30/2020   Chondromalacia patellae 11/28/2020   Internal derangement of right knee 11/28/2020   Essential hypertension 11/28/2020   Anxiety and depression 11/08/2020     Current Outpatient Medications on File Prior to Visit  Medication Sig Dispense Refill   diphenhydrAMINE (BENADRYL) 25 MG tablet Take 25 mg by mouth at bedtime as needed.     escitalopram (LEXAPRO) 20 MG tablet Take 1 tablet (20 mg total) by mouth daily. 90 tablet 0   omeprazole (PRILOSEC) 20 MG capsule Take 1 capsule (20 mg total) by mouth daily. 30 capsule 0   No current facility-administered medications on file prior to visit.    Allergies  Allergen Reactions   Bee Venom Shortness Of Breath and Swelling   Asa [Aspirin] Other (See Comments)    "Makes me feel funny"    Social History   Socioeconomic History   Marital status: Single    Spouse name: Not on file   Number of children: Not on file   Years of education: Not on file   Highest education  level: Not on file  Occupational History   Not on file  Tobacco Use   Smoking status: Never   Smokeless tobacco: Never  Vaping Use   Vaping Use: Former  Substance and Sexual Activity   Alcohol use: Yes    Comment: occasionally on weekends   Drug use: Yes    Types: Marijuana   Sexual activity: Not on file  Other Topics Concern   Not on file  Social History Narrative   Not on file   Social Determinants of Health   Financial Resource Strain: Not on file  Food Insecurity: Not on file  Transportation Needs: Not on file  Physical Activity: Not on file  Stress: Not on file  Social Connections: Not on file  Intimate Partner Violence: Not on file    Family History  Problem Relation Age of Onset   Hypertension Mother    Diabetes Mother     Past Surgical History:  Procedure Laterality Date   TUBAL LIGATION      ROS: Review of Systems Negative except as stated above  PHYSICAL EXAM: BP 128/77 (BP Location: Left Arm, Patient Position: Sitting, Cuff Size: Normal)    Pulse 95    Temp 98.3 F (36.8 C)    Resp 18  Ht 5' 4.02" (1.626 m)    Wt 160 lb 3.2 oz (72.7 kg)    SpO2 96%    BMI 27.48 kg/m   Physical Exam Exam conducted with a chaperone present.  HENT:     Head: Normocephalic and atraumatic.  Eyes:     Extraocular Movements: Extraocular movements intact.     Conjunctiva/sclera: Conjunctivae normal.     Pupils: Pupils are equal, round, and reactive to light.  Cardiovascular:     Rate and Rhythm: Normal rate and regular rhythm.     Pulses: Normal pulses.     Heart sounds: Normal heart sounds.  Genitourinary:    General: Normal vulva.     Vagina: Normal.     Cervix: Normal.     Uterus: Normal.      Adnexa: Right adnexa normal and left adnexa normal.     Comments: Margorie John, CMA present during exam.  Musculoskeletal:     Cervical back: Normal range of motion and neck supple. No rigidity.  Neurological:     General: No focal deficit present.     Mental  Status: She is alert and oriented to person, place, and time.  Psychiatric:        Mood and Affect: Mood normal.        Behavior: Behavior normal.   ASSESSMENT AND PLAN: 1. Pap smear for cervical cancer screening: - Cytology - PAP for cervical cancer screening.  - Cytology - PAP([facility])  2. Routine screening for STI (sexually transmitted infection): - Cervicovaginal self-swab to screen for chlamydia, gonorrhea, trichomonas, bacterial vaginitis, and candida vaginitis. - Cervicovaginal ancillary only  3. Anxiety and depression: - Patient denies thoughts of self-harm, suicidal ideations, homicidal ideations. - Continue Escitalopram as prescribed. No refills needed as of present.  - Follow-up with primary provider as scheduled.    Patient was given the opportunity to ask questions.  Patient verbalized understanding of the plan and was able to repeat key elements of the plan. Patient was given clear instructions to go to Emergency Department or return to medical center if symptoms don't improve, worsen, or new problems develop.The patient verbalized understanding.   Follow-up with primary provider as scheduled.   [name], NP

## 2021-01-19 ENCOUNTER — Ambulatory Visit (INDEPENDENT_AMBULATORY_CARE_PROVIDER_SITE_OTHER): Payer: 59 | Admitting: Family

## 2021-01-19 ENCOUNTER — Encounter: Payer: Self-pay | Admitting: Family

## 2021-01-19 ENCOUNTER — Other Ambulatory Visit (HOSPITAL_COMMUNITY)
Admission: RE | Admit: 2021-01-19 | Discharge: 2021-01-19 | Disposition: A | Discharge status: Home / Self Care | Payer: 59 | Source: Ambulatory Visit | Attending: Family | Admitting: Family

## 2021-01-19 ENCOUNTER — Other Ambulatory Visit: Payer: Self-pay

## 2021-01-19 VITALS — BP 128/77 | HR 95 | Temp 98.3°F | Resp 18 | Ht 64.02 in | Wt 160.2 lb

## 2021-01-19 DIAGNOSIS — F419 Anxiety disorder, unspecified: Secondary | ICD-10-CM | POA: Diagnosis not present

## 2021-01-19 DIAGNOSIS — Z124 Encounter for screening for malignant neoplasm of cervix: Secondary | ICD-10-CM | POA: Insufficient documentation

## 2021-01-19 DIAGNOSIS — F32A Depression, unspecified: Secondary | ICD-10-CM | POA: Diagnosis not present

## 2021-01-19 DIAGNOSIS — Z113 Encounter for screening for infections with a predominantly sexual mode of transmission: Secondary | ICD-10-CM

## 2021-01-19 NOTE — Patient Instructions (Signed)
Pap Test °Why am I having this test? °A Pap test, also called a Pap smear, is a screening test to check for signs of: °Infection. °Cancer of the cervix. The cervix is the lower part of the uterus that opens into the vagina. °Changes that may be a sign that cancer is developing (precancerous changes). °Women need this test on a regular basis. In general, you should have a Pap test every 3 years until you reach menopause or age 40. Women aged 30-60 may choose to have their Pap test done at the same time as an HPV (human papillomavirus) test every 5 years (instead of every 3 years). °Your health care provider may recommend having Pap tests more or less often depending on your medical conditions and past Pap test results. °What is being tested? °Cervical cells are tested for signs of infection or abnormalities. °What kind of sample is taken? °Your health care provider will collect a sample of cells from the surface of your cervix. This will be done using a small cotton swab, plastic spatula, or brush that is inserted into your vagina using a tool called a speculum. This sample is often collected during a pelvic exam, when you are lying on your back on an exam table with your feet in footrests (stirrups). In some cases, fluids (secretions) from the cervix or vagina may also be collected. °How do I prepare for this test? °Be aware of where you are in your menstrual cycle. If you are menstruating on the day of the test, you may be asked to reschedule. °You may need to reschedule if you have a known vaginal infection on the day of the test. °Follow instructions from your health care provider about: °Changing or stopping your regular medicines. Some medicines can cause abnormal test results, such as vaginal medicines and tetracycline. °Avoiding douching 2-3 days before or the day of the test. °Tell a health care provider about: °Any allergies you have. °All medicines you are taking, including vitamins, herbs, eye drops,  creams, and over-the-counter medicines. °Any bleeding problems you have. °Any surgeries you have had. °Any medical conditions you have. °Whether you are pregnant or may be pregnant. °How are the results reported? °Your test results will be reported as either abnormal or normal. °What do the results mean? °A normal test result means that you do not have signs of cancer of the cervix. °An abnormal result may mean that you have: °Cancer. A Pap test by itself is not enough to diagnose cancer. You will have more tests done if cancer is suspected. °Precancerous changes in your cervix. °Inflammation of the cervix. °An STI (sexually transmitted infection). °A fungal infection. °A parasite infection. °Talk with your health care provider about what your results mean. In some cases, your health care provider may do more testing to confirm the results. °Questions to ask your health care provider °Ask your health care provider, or the department that is doing the test: °When will my results be ready? °How will I get my results? °What are my treatment options? °What other tests do I need? °What are my next steps? °Summary °In general, women should have a Pap test every 3 years until they reach menopause or age 40. °Your health care provider will collect a sample of cells from the surface of your cervix. This will be done using a small cotton swab, plastic spatula, or brush. °In some cases, fluids (secretions) from the cervix or vagina may also be collected. °This information is not intended   to replace advice given to you by your health care provider. Make sure you discuss any questions you have with your health care provider. °Document Revised: 04/01/2020 Document Reviewed: 04/01/2020 °Elsevier Patient Education © 2022 [employer] ° °

## 2021-01-19 NOTE — Progress Notes (Signed)
Pt presents for gynecological exam  °

## 2021-01-20 ENCOUNTER — Other Ambulatory Visit: Payer: Self-pay | Admitting: Family

## 2021-01-20 DIAGNOSIS — N760 Acute vaginitis: Secondary | ICD-10-CM | POA: Insufficient documentation

## 2021-01-20 DIAGNOSIS — B9689 Other specified bacterial agents as the cause of diseases classified elsewhere: Secondary | ICD-10-CM | POA: Insufficient documentation

## 2021-01-20 LAB — CERVICOVAGINAL ANCILLARY ONLY
Bacterial Vaginitis (gardnerella): POSITIVE — AB
Candida Glabrata: NEGATIVE
Candida Vaginitis: NEGATIVE
Chlamydia: NEGATIVE
Comment: NEGATIVE
Comment: NEGATIVE
Comment: NEGATIVE
Comment: NEGATIVE
Comment: NEGATIVE
Comment: NORMAL
Neisseria Gonorrhea: NEGATIVE
Trichomonas: NEGATIVE

## 2021-01-20 MED ORDER — METRONIDAZOLE 500 MG PO TABS: 500.0000 mg | ORAL_TABLET | Freq: Two times a day (BID) | ORAL | 0 refills | Status: AC

## 2021-01-20 NOTE — Progress Notes (Signed)
Gonorrhea, Chlamydia, Trichomonas, and Candida Vaginitis (sometimes called a yeast infection) negative.  Positive for Bacterial Vaginitis, an overgrowth of normal bacteria in the vagina. Prescribed Metronidazole (Flagyl) twice per day for 7 days. Do not drink alcohol while taking this medication as this may cause severe nausea, vomiting, and upset stomach.   

## 2021-01-24 ENCOUNTER — Other Ambulatory Visit: Payer: Self-pay | Admitting: Family

## 2021-01-24 DIAGNOSIS — R87610 Atypical squamous cells of undetermined significance on cytologic smear of cervix (ASC-US): Secondary | ICD-10-CM

## 2021-01-24 LAB — CYTOLOGY - PAP
Comment: NEGATIVE
Comment: NEGATIVE
Diagnosis: UNDETERMINED — AB
HPV 16: NEGATIVE
HPV 18 / 45: NEGATIVE
High risk HPV: POSITIVE — AB

## 2021-01-24 NOTE — Progress Notes (Signed)
Referral to Gynecology for abnormal PAP. Their office should call patient within 2 weeks with appointment details.

## 2021-03-07 ENCOUNTER — Ambulatory Visit (INDEPENDENT_AMBULATORY_CARE_PROVIDER_SITE_OTHER): Payer: 59

## 2021-03-07 ENCOUNTER — Encounter: Payer: Self-pay | Admitting: Emergency Medicine

## 2021-03-07 ENCOUNTER — Ambulatory Visit
Admission: EM | Admit: 2021-03-07 | Discharge: 2021-03-07 | Disposition: A | Discharge status: Home / Self Care | Payer: 59 | Attending: Internal Medicine | Admitting: Internal Medicine

## 2021-03-07 ENCOUNTER — Other Ambulatory Visit: Payer: Self-pay

## 2021-03-07 DIAGNOSIS — S67192A Crushing injury of right middle finger, initial encounter: Secondary | ICD-10-CM | POA: Diagnosis not present

## 2021-03-07 DIAGNOSIS — M79644 Pain in right finger(s): Secondary | ICD-10-CM | POA: Diagnosis not present

## 2021-03-07 DIAGNOSIS — S6991XA Unspecified injury of right wrist, hand and finger(s), initial encounter: Secondary | ICD-10-CM

## 2021-03-07 NOTE — ED Triage Notes (Signed)
Pt here for right middle finger injury after slamming in door of car; pt noted to be hyperventilating; advised to deep breath

## 2021-03-07 NOTE — Discharge Instructions (Signed)
X-ray was normal.  Please continue ice application and take over-the-counter pain relievers as needed.  Follow-up if pain persist or worsens.

## 2021-03-07 NOTE — ED Provider Notes (Signed)
[facility]    CSN: [id number] Arrival date & time: 03/07/21  1028      History   Chief Complaint Chief Complaint  Patient presents with   Finger Injury    HPI Nichole Garcia is a 41 y.o. female.   Patient presents with right third digit pain that occurred today after an injury.  She reports that she slammed her finger in the door at approximately 9:30 this morning.  Pain is present at the distal end of the finger.  No numbness or tingling.  Patient has not reported taking any medications to help alleviate symptoms.     Past Medical History:  Diagnosis Date   Acid reflux    Migraines     Patient Active Problem List   Diagnosis Date Noted   Bacterial vaginitis 01/20/2021   Prediabetes 12/30/2020   Chondromalacia patellae 11/28/2020   Internal derangement of right knee 11/28/2020   Essential hypertension 11/28/2020   Anxiety and depression 11/08/2020    Past Surgical History:  Procedure Laterality Date   TUBAL LIGATION      OB History   No obstetric history on file.      Home Medications    Prior to Admission medications   Medication Sig Start Date End Date Taking? Authorizing Provider  diphenhydrAMINE (BENADRYL) 25 MG tablet Take 25 mg by mouth at bedtime as needed.    [provider]  escitalopram (LEXAPRO) 20 MG tablet Take 1 tablet (20 mg total) by mouth daily. 12/29/20 03/29/21  [name], NP  omeprazole (PRILOSEC) 20 MG capsule Take 1 capsule (20 mg total) by mouth daily. 08/13/20   Raspet, Noberto Retort, PA-C    Family History Family History  Problem Relation Age of Onset   Hypertension Mother    Diabetes Mother     Social History Social History   Tobacco Use   Smoking status: Never   Smokeless tobacco: Never  Vaping Use   Vaping Use: Former  Substance Use Topics   Alcohol use: Yes    Comment: occasionally on weekends   Drug use: Yes    Types: Marijuana     Allergies   Bee venom and Asa [aspirin]   Review of  Systems Review of Systems Per HPI  Physical Exam Triage Vital Signs ED Triage Vitals  Enc Vitals Group     BP 03/07/21 1050 (!) 151/95     Pulse Rate 03/07/21 1050 80     Resp 03/07/21 1050 20     Temp 03/07/21 1050 98.4 F (36.9 C)     Temp Source 03/07/21 1050 Oral     SpO2 03/07/21 1050 99 %     Weight --      Height --      Head Circumference --      Peak Flow --      Pain Score 03/07/21 1051 10     Pain Loc --      Pain Edu? --      Excl. in GC? --    No data found.  Updated Vital Signs BP (!) 151/95 (BP Location: Left Arm)    Pulse 80    Temp 98.4 F (36.9 C) (Oral)    Resp 20    SpO2 99%   Visual Acuity Right Eye Distance:   Left Eye Distance:   Bilateral Distance:    Right Eye Near:   Left Eye Near:    Bilateral Near:     Physical Exam Constitutional:  General: She is not in acute distress.    Appearance: Normal appearance. She is not toxic-appearing or diaphoretic.  HENT:     Head: Normocephalic and atraumatic.  Eyes:     Extraocular Movements: Extraocular movements intact.     Conjunctiva/sclera: Conjunctivae normal.  Pulmonary:     Effort: Pulmonary effort is normal.  Musculoskeletal:     Right hand: Swelling, tenderness and bony tenderness present. No deformity. Normal range of motion. Normal strength. Normal sensation. There is no disruption of two-point discrimination. Normal capillary refill. Normal pulse.     Left hand: Normal.     Comments: Tenderness to palpation to distal end of right third digit.  There is associated bruising and erythema noted to area as well.  Nail is intact.  No obvious abrasions or lacerations noted.  There is a small subungual hematoma noted to proximal end of nailbed.  Neurovascular intact.  Neurological:     General: No focal deficit present.     Mental Status: She is alert and oriented to person, place, and time. Mental status is at baseline.  Psychiatric:        Mood and Affect: Mood normal.        Behavior:  Behavior normal.        Thought Content: Thought content normal.        Judgment: Judgment normal.     UC Treatments / Results  Labs (all labs ordered are listed, but only abnormal results are displayed) Labs Reviewed - No data to display  EKG   Radiology DG Finger Middle Right  Result Date: 03/07/2021 CLINICAL DATA:  Slammed middle finger in car door EXAM: RIGHT MIDDLE FINGER 2+V COMPARISON:  None. FINDINGS: There is no acute fracture or dislocation. Bony alignment is normal. The joint spaces are preserved. There is no erosive change. The soft tissues are unremarkable. IMPRESSION: No acute fracture or dislocation. Electronically Signed   By: Lesia Hausen M.D.   On: 03/07/2021 11:15    Procedures Procedures (including critical care time)  Medications Ordered in UC Medications - No data to display  Initial Impression / Assessment and Plan / UC Course  I have reviewed the triage vital signs and the nursing notes.  Pertinent labs & imaging results that were available during my care of the patient were reviewed by me and considered in my medical decision making (see chart for details).     Right finger x-ray was negative for any acute bony abnormality.  Suspect contusion.  Advised patient that it would be best to drain subungual hematoma but patient declined.  Risks associated with not doing drainage were discussed with patient.  Patient voiced understanding.  Patient is neurovascularly intact so I do not think the patient is in need of immediate orthopedic referral at this time.  Discussed supportive care and ice application with patient.  Discussed return precautions.  Patient verbalized understanding and was agreeable with plan. Final Clinical Impressions(s) / UC Diagnoses   Final diagnoses:  Finger pain, right  Injury of finger of right hand, initial encounter     Discharge Instructions      X-ray was normal.  Please continue ice application and take over-the-counter pain  relievers as needed.  Follow-up if pain persist or worsens.     ED Prescriptions   None    PDMP not reviewed this encounter.   Gustavus Bryant, [place] 03/07/21 1140

## 2021-09-28 ENCOUNTER — Emergency Department (HOSPITAL_COMMUNITY)
Admission: EM | Admit: 2021-09-28 | Discharge: 2021-09-28 | Disposition: A | Discharge status: Home / Self Care | Payer: 59 | Attending: Emergency Medicine | Admitting: Emergency Medicine

## 2021-09-28 ENCOUNTER — Encounter (HOSPITAL_COMMUNITY): Payer: Self-pay | Admitting: Emergency Medicine

## 2021-09-28 DIAGNOSIS — J069 Acute upper respiratory infection, unspecified: Secondary | ICD-10-CM | POA: Insufficient documentation

## 2021-09-28 DIAGNOSIS — J029 Acute pharyngitis, unspecified: Secondary | ICD-10-CM | POA: Diagnosis present

## 2021-09-28 LAB — GROUP A STREP BY PCR: Group A Strep by PCR: NOT DETECTED

## 2021-09-28 NOTE — ED Provider Notes (Signed)
[facility]-EMERGENCY DEPT Provider Note   CSN: [id number] Arrival date & time: 09/28/21  1024     History  Chief Complaint  Patient presents with   Sore Throat    Nichole Garcia is a 41 y.o. female with history of acid reflux presents to the emergency department for evaluation of sore throat and some sinus congestion with pressure.  Patient's daughter tested positive for strep yesterday and today she is here with her other daughter who also complains of sore throat.  She denies fevers, cough, abdominal pain, nausea, vomiting and diarrhea.  She has been taking over-the-counter medications without relief.   Sore Throat Pertinent negatives include no abdominal pain.       Home Medications Prior to Admission medications   Medication Sig Start Date End Date Taking? Authorizing Provider  diphenhydrAMINE (BENADRYL) 25 MG tablet Take 25 mg by mouth at bedtime as needed.    [provider]  escitalopram (LEXAPRO) 20 MG tablet Take 1 tablet (20 mg total) by mouth daily. 12/29/20 03/29/21  [name], NP  omeprazole (PRILOSEC) 20 MG capsule Take 1 capsule (20 mg total) by mouth daily. 08/13/20   Raspet, Noberto Retort, PA-C      Allergies    Bee venom and Asa [aspirin]    Review of Systems   Review of Systems  Constitutional:  Negative for fever.  HENT:  Positive for sinus pressure and sore throat.   Respiratory:  Negative for cough.   Gastrointestinal:  Negative for abdominal pain, diarrhea, nausea and vomiting.    Physical Exam Updated Vital Signs BP (!) 127/93 (BP Location: Left Arm)   Pulse (!) 106   Temp 99.1 F (37.3 C) (Oral)   Resp 16   SpO2 100%  Physical Exam Vitals and nursing note reviewed.  Constitutional:      General: She is not in acute distress.    Appearance: She is not ill-appearing.  HENT:     Head: Atraumatic.     Right Ear: Tympanic membrane normal.     Left Ear: Tympanic membrane normal.     Nose: Congestion present. No  rhinorrhea.     Right Turbinates: Swollen.     Left Turbinates: Swollen.     Comments: Mild sinus tenderness    Mouth/Throat:     Mouth: No oral lesions.     Pharynx: Uvula midline. Posterior oropharyngeal erythema present. No pharyngeal swelling or uvula swelling.  Eyes:     Conjunctiva/sclera: Conjunctivae normal.  Cardiovascular:     Rate and Rhythm: Normal rate and regular rhythm.     Pulses: Normal pulses.     Heart sounds: No murmur heard. Pulmonary:     Effort: Pulmonary effort is normal. No respiratory distress.     Breath sounds: Normal breath sounds.  Abdominal:     General: Abdomen is flat. There is no distension.     Palpations: Abdomen is soft.     Tenderness: There is no abdominal tenderness.  Musculoskeletal:        General: Normal range of motion.     Cervical back: Normal range of motion.  Skin:    General: Skin is warm and dry.     Capillary Refill: Capillary refill takes less than 2 seconds.  Neurological:     General: No focal deficit present.     Mental Status: She is alert.  Psychiatric:        Mood and Affect: Mood normal.     ED Results /  Procedures / Treatments   Labs (all labs ordered are listed, but only abnormal results are displayed) Labs Reviewed  GROUP A STREP BY PCR    EKG None  Radiology No results found.  Procedures Procedures    Medications Ordered in ED Medications - No data to display  ED Course/ Medical Decision Making/ A&P                           Medical Decision Making  41 year old female presents emergency department for evaluation of sore throat and sinus pressure.  Differentials include strep throat, sinusitis, viral upper respiratory infection, COVID, tonsillar abscess.  Vitals indicate mild tachycardia at 106 but were otherwise unremarkable.  On exam, she does have some mild sinus tenderness.  Nasal turbinates swollen.  Posterior oropharynx is erythematous without exudate, uvula swelling.  Uvula is midline.   Airway is intact.  Lungs CTA bilaterally.  I ordered strep test which was negative.  Symptoms consistent with a viral upper respiratory infection.  Advised symptomatic management over-the-counter including Mucinex decongestant and Flonase for sinus pressure.  Follow-up with PCP as needed.  Patient discharged home in stable condition.   Final Clinical Impression(s) / ED Diagnoses Final diagnoses:  Viral upper respiratory tract infection    Rx / DC Orders ED Discharge Orders     None         Janell Quiet, PA-C 09/28/21 1355    Loetta Rough, MD 09/28/21 [phone]

## 2021-09-28 NOTE — Discharge Instructions (Signed)
Your strep test was negative today.  This is likely another viral infection.  I would advise symptomatic management with over-the-counter medications such as Mucinex decongestant and Flonase for sinus congestion and discomfort.  Tylenol or Motrin as needed for pain.  You should feel better in several days.

## 2021-09-28 NOTE — ED Triage Notes (Signed)
Pt here from home with c/o sore throat . Throat is slightly red , one of her kids just  got dx with strept

## 2021-10-27 ENCOUNTER — Other Ambulatory Visit: Payer: Self-pay

## 2021-10-27 ENCOUNTER — Emergency Department (HOSPITAL_COMMUNITY)
Admission: EM | Admit: 2021-10-27 | Discharge: 2021-10-27 | Disposition: A | Discharge status: Home / Self Care | Payer: 59 | Attending: Emergency Medicine | Admitting: Emergency Medicine

## 2021-10-27 ENCOUNTER — Encounter (HOSPITAL_COMMUNITY): Payer: Self-pay

## 2021-10-27 DIAGNOSIS — M791 Myalgia, unspecified site: Secondary | ICD-10-CM | POA: Insufficient documentation

## 2021-10-27 DIAGNOSIS — L02411 Cutaneous abscess of right axilla: Secondary | ICD-10-CM | POA: Diagnosis present

## 2021-10-27 MED ORDER — HYDROCODONE-ACETAMINOPHEN 5-325 MG PO TABS: 1.0000 | ORAL_TABLET | ORAL | 0 refills | Status: DC | PRN

## 2021-10-27 MED ORDER — LIDOCAINE-EPINEPHRINE (PF) 2 %-1:200000 IJ SOLN
20.0000 mL | Freq: Once | INTRAMUSCULAR | Status: AC
  Administered 2021-10-27: 20 mL
  Filled 2021-10-27: qty 20

## 2021-10-27 MED ORDER — BACITRACIN ZINC 500 UNIT/GM EX OINT
TOPICAL_OINTMENT | CUTANEOUS | Status: AC
  Filled 2021-10-27: qty 0.9

## 2021-10-27 MED ORDER — HYDROCODONE-ACETAMINOPHEN 5-325 MG PO TABS
2.0000 | ORAL_TABLET | Freq: Once | ORAL | Status: AC
  Administered 2021-10-27: 2 via ORAL
  Filled 2021-10-27: qty 2

## 2021-10-27 MED ORDER — DOXYCYCLINE HYCLATE 100 MG PO TABS
100.0000 mg | ORAL_TABLET | Freq: Once | ORAL | Status: AC
  Administered 2021-10-27: 100 mg via ORAL
  Filled 2021-10-27: qty 1

## 2021-10-27 MED ORDER — DOXYCYCLINE HYCLATE 100 MG PO CAPS: 100.0000 mg | ORAL_CAPSULE | Freq: Two times a day (BID) | ORAL | 0 refills | Status: DC

## 2021-10-27 NOTE — Discharge Instructions (Addendum)
Incision and drainage of your right axillary abscess was successful in draining a moderate amount of pustulant discharge.  Recommend that you follow-up with your PCP for wound reevaluation.  In the meantime please take the full 10-day course of doxycycline to treat for infection associated with your abscess.  If you have new fever or pain from your abscess tracking down your chest due to swelling and redness associated with your abscess please return to the emergency department for further evaluation.  You may remove the iodoform packing in 4 to 5 days.

## 2021-10-27 NOTE — ED Triage Notes (Signed)
Patient  c/o abscess to the right axilla x 3 days.

## 2021-10-27 NOTE — ED Provider Notes (Signed)
[facility] DEPT Provider Note   CSN: [id number] Arrival date & time: 10/27/21  1203     History  Chief Complaint  Patient presents with   Abscess   HPI Nichole Garcia is a 41 y.o. female presenting for abscess.  Noticed it on Tuesday of this past week.  Located about her right axilla.  Not oozing or bleeding.  Abscesses painful and seems to be increasing in size.  Does endorse body aches but no fever.  Signs of lesion is approximately golf ball size.  Had an abscess in the same location about a year ago that required draining.   Abscess      Home Medications Prior to Admission medications   Medication Sig Start Date End Date Taking? Authorizing Provider  doxycycline (VIBRAMYCIN) 100 MG capsule Take 1 capsule (100 mg total) by mouth 2 (two) times daily. 10/27/21  Yes Harriet Pho, PA-C  HYDROcodone-acetaminophen (NORCO/VICODIN) 5-325 MG tablet Take 1 tablet by mouth every 4 (four) hours as needed. 10/27/21  Yes Harriet Pho, PA-C  diphenhydrAMINE (BENADRYL) 25 MG tablet Take 25 mg by mouth at bedtime as needed.    [provider]  escitalopram (LEXAPRO) 20 MG tablet Take 1 tablet (20 mg total) by mouth daily. 12/29/20 03/29/21  Camillia Herter, NP  omeprazole (PRILOSEC) 20 MG capsule Take 1 capsule (20 mg total) by mouth daily. 08/13/20   Raspet, Derry Skill, PA-C      Allergies    Bee venom and Asa [aspirin]    Review of Systems   Review of Systems  Skin:  Positive for wound.    Physical Exam Updated Vital Signs BP (!) 143/98 (BP Location: Left Arm)   Pulse 89   Temp 99.3 F (37.4 C) (Oral)   Resp 16   Ht 5' 4.5" (1.638 m)   Wt 74.8 kg   LMP 10/03/2021   SpO2 98%   BMI 27.88 kg/m  Physical Exam Constitutional:      Appearance: Normal appearance.  HENT:     Head: Normocephalic.     Nose: Nose normal.  Eyes:     Conjunctiva/sclera: Conjunctivae normal.  Cardiovascular:     Rate and Rhythm: Normal rate and regular  rhythm.  Pulmonary:     Effort: Pulmonary effort is normal.  Musculoskeletal:     Comments: Large abscess noted about the right axilla.  Cannot appreciate a punctate.  Size approximately 1.5 inches in diameter surrounded by a 1 inch area of induration.  Neurological:     Mental Status: She is alert.  Psychiatric:        Mood and Affect: Mood normal.     ED Results / Procedures / Treatments   Labs (all labs ordered are listed, but only abnormal results are displayed) Labs Reviewed - No data to display  EKG None  Radiology No results found.  Procedures .Marland KitchenIncision and Drainage  Date/Time: 10/27/2021 3:40 PM  Performed by: Harriet Pho, PA-C Authorized by: Harriet Pho, PA-C   Consent:    Consent obtained:  Verbal   Consent given by:  Patient   Risks discussed:  Bleeding, incomplete drainage, pain and infection   Alternatives discussed:  No treatment Universal protocol:    Procedure explained and questions answered to patient or proxy's satisfaction: yes     Relevant documents present and verified: yes     Patient identity confirmed:  Verbally with patient and arm band Location:    Type:  Abscess  Size:  1.5 inches in diamter   Location:  Upper extremity   Upper extremity location: right axila. Pre-procedure details:    Skin preparation:  Povidone-iodine Sedation:    Sedation type:  None Anesthesia:    Anesthesia method:  Local infiltration   Local anesthetic:  Lidocaine 2% WITH epi Procedure type:    Complexity:  Simple Procedure details:    Ultrasound guidance: no     Needle aspiration: no     Incision types:  Stab incision   Incision depth:  Subcutaneous   Wound management:  Probed and deloculated   Drainage:  Purulent and bloody   Drainage amount:  Moderate   Wound treatment:  Wound left open   Packing materials:  1/4 in iodoform gauze Post-procedure details:    Procedure completion:  Tolerated well, no immediate complications      Medications Ordered in ED Medications  lidocaine-EPINEPHrine (XYLOCAINE W/EPI) 2 %-1:200000 (PF) injection 20 mL (20 mLs Infiltration Given by Other 10/27/21 1502)  HYDROcodone-acetaminophen (NORCO/VICODIN) 5-325 MG per tablet 2 tablet (2 tablets Oral Given 10/27/21 1458)  bacitracin 500 UNIT/GM ointment (  Given 10/27/21 1539)  doxycycline (VIBRA-TABS) tablet 100 mg (100 mg Oral Given 10/27/21 1542)    ED Course/ Medical Decision Making/ A&P                           Medical Decision Making Risk Prescription drug management.   Patient presented for abscess about her right axilla.  I&D procedure was well-tolerated.  Able to express a moderate amount of purulent discharge.  Treated with doxycycline for associated infection.  Discussed return precautions.  Started patient on a 10-day course of doxycycline for empiric coverage of associated infection.  Treated pain with Norco.  Patient stated that pain was improved upon reevaluation.  Discussed post I&D wound care and follow-up with PCP.        Final Clinical Impression(s) / ED Diagnoses Final diagnoses:  Abscess of axilla, right    Rx / DC Orders ED Discharge Orders          Ordered    HYDROcodone-acetaminophen (NORCO/VICODIN) 5-325 MG tablet  Every 4 hours PRN        10/27/21 1536    doxycycline (VIBRAMYCIN) 100 MG capsule  2 times daily        10/27/21 1538              Harriet Pho, PA-C 10/27/21 1547    Jeanell Sparrow, DO 10/28/21 2034

## 2022-03-01 ENCOUNTER — Ambulatory Visit
Admission: EM | Admit: 2022-03-01 | Discharge: 2022-03-01 | Disposition: A | Discharge status: Home / Self Care | Payer: 59 | Attending: Internal Medicine | Admitting: Internal Medicine

## 2022-03-01 ENCOUNTER — Encounter: Payer: Self-pay | Admitting: Emergency Medicine

## 2022-03-01 ENCOUNTER — Other Ambulatory Visit: Payer: Self-pay

## 2022-03-01 DIAGNOSIS — J069 Acute upper respiratory infection, unspecified: Secondary | ICD-10-CM | POA: Diagnosis not present

## 2022-03-01 MED ORDER — ALBUTEROL SULFATE HFA 108 (90 BASE) MCG/ACT IN AERS: 1.0000 | INHALATION_SPRAY | Freq: Four times a day (QID) | RESPIRATORY_TRACT | 0 refills | Status: DC | PRN

## 2022-03-01 MED ORDER — PREDNISONE 20 MG PO TABS: 40.0000 mg | ORAL_TABLET | Freq: Every day | ORAL | 0 refills | Status: AC

## 2022-03-01 NOTE — ED Triage Notes (Signed)
Reports symptoms started around one week ago.  Patient was coughing up phlegm, runny nose, tearful eyes.  Was taking over the counter sinus medicines.  Today noticed change in voice, chest tightness, scratchy throat.  Does not hurt to swallow, only hurts with talking per patient.

## 2022-03-01 NOTE — Discharge Instructions (Signed)
It appears that you have a viral illness.  I have prescribed an inhaler and prednisone to help alleviate inflammation and chest tightness.  Please follow-up if any symptoms persist or worsen.

## 2022-03-01 NOTE — ED Provider Notes (Signed)
[facility]    CSN: [id number] Arrival date & time: 03/01/22  [id number]      History   Chief Complaint Chief Complaint  Patient presents with   URI    HPI Nichole Garcia is a 42 y.o. female.   Patient presents with productive cough, runny nose, nasal congestion, sinus pressure that started about 7 days ago.  Patient reports that she started noticing some chest tightness and increased hoarseness in voice over the past 24 hours.  Denies any associated fever or known sick contacts.  Patient has taken over-the-counter Benadryl and Zyrtec with minimal improvement in symptoms.  Patient denies history of asthma or COPD.  Patient does not smoke cigarettes.  Denies chest pain or shortness of breath.   URI   Past Medical History:  Diagnosis Date   Acid reflux    Migraines     Patient Active Problem List   Diagnosis Date Noted   Bacterial vaginitis 01/20/2021   Prediabetes 12/30/2020   Chondromalacia patellae 11/28/2020   Internal derangement of right knee 11/28/2020   Essential hypertension 11/28/2020   Anxiety and depression 11/08/2020    Past Surgical History:  Procedure Laterality Date   TUBAL LIGATION      OB History   No obstetric history on file.      Home Medications    Prior to Admission medications   Medication Sig Start Date End Date Taking? Authorizing Provider  albuterol (VENTOLIN HFA) 108 (90 Base) MCG/ACT inhaler Inhale 1-2 puffs into the lungs every 6 (six) hours as needed for wheezing or shortness of breath. 03/01/22  Yes Edan Juday, Hildred Alamin E, FNP  predniSONE (DELTASONE) 20 MG tablet Take 2 tablets (40 mg total) by mouth daily for 5 days. 03/01/22 03/06/22 Yes Rakwon Letourneau, Michele Rockers, FNP  diphenhydrAMINE (BENADRYL) 25 MG tablet Take 25 mg by mouth at bedtime as needed.    [provider]  doxycycline (VIBRAMYCIN) 100 MG capsule Take 1 capsule (100 mg total) by mouth 2 (two) times daily. Patient not taking: Reported on 03/01/2022 10/27/21   Harriet Pho, PA-C  escitalopram (LEXAPRO) 20 MG tablet Take 1 tablet (20 mg total) by mouth daily. Patient not taking: Reported on 03/01/2022 12/29/20 03/29/21  Camillia Herter, NP  HYDROcodone-acetaminophen (NORCO/VICODIN) 5-325 MG tablet Take 1 tablet by mouth every 4 (four) hours as needed. Patient not taking: Reported on 03/01/2022 10/27/21   Harriet Pho, PA-C  omeprazole (PRILOSEC) 20 MG capsule Take 1 capsule (20 mg total) by mouth daily. Patient not taking: Reported on 03/01/2022 08/13/20   Raspet, Derry Skill, PA-C    Family History Family History  Problem Relation Age of Onset   Hypertension Mother    Diabetes Mother     Social History Social History   Tobacco Use   Smoking status: Never   Smokeless tobacco: Never  Vaping Use   Vaping Use: Never used  Substance Use Topics   Alcohol use: Yes    Comment: occasionally on weekends   Drug use: Yes    Types: Marijuana     Allergies   Bee venom and Asa [aspirin]   Review of Systems Review of Systems Per HPI  Physical Exam Triage Vital Signs ED Triage Vitals  Enc Vitals Group     BP 03/01/22 1126 (!) 141/96     Pulse Rate 03/01/22 1126 91     Resp 03/01/22 1126 (!) 21     Temp 03/01/22 1126 97.8 F (36.6 C)  Temp src --      SpO2 03/01/22 1126 98 %     Weight --      Height --      Head Circumference --      Peak Flow --      Pain Score 03/01/22 1123 4     Pain Loc --      Pain Edu? --      Excl. in [place]? --    No data found.  Updated Vital Signs BP (!) 141/96 (BP Location: Right Arm)   Pulse 91   Temp 97.8 F (36.6 C)   Resp (!) 21   LMP 02/16/2022   SpO2 98%   Visual Acuity Right Eye Distance:   Left Eye Distance:   Bilateral Distance:    Right Eye Near:   Left Eye Near:    Bilateral Near:     Physical Exam Constitutional:      General: She is not in acute distress.    Appearance: Normal appearance. She is not toxic-appearing or diaphoretic.  HENT:     Head: Normocephalic and atraumatic.      Right Ear: Tympanic membrane and ear canal normal.     Left Ear: Tympanic membrane and ear canal normal.     Nose: Congestion present.     Mouth/Throat:     Mouth: Mucous membranes are moist.     Pharynx: No posterior oropharyngeal erythema.  Eyes:     Extraocular Movements: Extraocular movements intact.     Conjunctiva/sclera: Conjunctivae normal.     Pupils: Pupils are equal, round, and reactive to light.  Cardiovascular:     Rate and Rhythm: Normal rate and regular rhythm.     Pulses: Normal pulses.     Heart sounds: Normal heart sounds.  Pulmonary:     Effort: Pulmonary effort is normal. No respiratory distress.     Breath sounds: Normal breath sounds. No stridor. No wheezing, rhonchi or rales.  Abdominal:     General: Abdomen is flat. Bowel sounds are normal.     Palpations: Abdomen is soft.  Musculoskeletal:        General: Normal range of motion.     Cervical back: Normal range of motion.  Skin:    General: Skin is warm and dry.  Neurological:     General: No focal deficit present.     Mental Status: She is alert and oriented to person, place, and time. Mental status is at baseline.  Psychiatric:        Mood and Affect: Mood normal.        Behavior: Behavior normal.      UC Treatments / Results  Labs (all labs ordered are listed, but only abnormal results are displayed) Labs Reviewed - No data to display  EKG   Radiology No results found.  Procedures Procedures (including critical care time)  Medications Ordered in UC Medications - No data to display  Initial Impression / Assessment and Plan / UC Course  I have reviewed the triage vital signs and the nursing notes.  Pertinent labs & imaging results that were available during my care of the patient were reviewed by me and considered in my medical decision making (see chart for details).     Patient presents with symptoms likely from a viral upper respiratory infection. Patient is nontoxic  appearing and not in need of emergent medical intervention.  There are no adventitious lung sounds on exam or signs of respiratory compromise so do  not think that chest imaging is necessary.  Suspect chest tightness is due to possible bronchitis/inflammation in chest.  Will treat with prednisone and albuterol inhaler.  There are no contraindications noted to steroid therapy in patient's history.  Do not think viral testing is necessary given duration of symptoms as it would not change treatment.  Advised supportive care as well.  Return if symptoms fail to improve. Patient states understanding and is agreeable.  Discharged with PCP followup.  Final Clinical Impressions(s) / UC Diagnoses   Final diagnoses:  Viral upper respiratory tract infection with cough     Discharge Instructions      It appears that you have a viral illness.  I have prescribed an inhaler and prednisone to help alleviate inflammation and chest tightness.  Please follow-up if any symptoms persist or worsen.    ED Prescriptions     Medication Sig Dispense Auth. Provider   predniSONE (DELTASONE) 20 MG tablet Take 2 tablets (40 mg total) by mouth daily for 5 days. 10 tablet [place], Hildred Alamin E, [place]   albuterol (VENTOLIN HFA) 108 (90 Base) MCG/ACT inhaler Inhale 1-2 puffs into the lungs every 6 (six) hours as needed for wheezing or shortness of breath. 1 each Teodora Medici, [place]      PDMP not reviewed this encounter.   Teodora Medici, [place] 03/01/22 1149

## 2022-04-02 NOTE — Progress Notes (Signed)
Erroneous encounter-disregard

## 2022-04-06 ENCOUNTER — Encounter: Payer: 59 | Admitting: Family

## 2022-04-06 ENCOUNTER — Ambulatory Visit: Payer: Self-pay

## 2022-04-06 DIAGNOSIS — Z130 Encounter for screening for diseases of the blood and blood-forming organs and certain disorders involving the immune mechanism: Secondary | ICD-10-CM

## 2022-04-06 DIAGNOSIS — Z13220 Encounter for screening for lipoid disorders: Secondary | ICD-10-CM

## 2022-04-06 DIAGNOSIS — Z1329 Encounter for screening for other suspected endocrine disorder: Secondary | ICD-10-CM

## 2022-04-06 DIAGNOSIS — Z13228 Encounter for screening for other metabolic disorders: Secondary | ICD-10-CM

## 2022-04-06 DIAGNOSIS — Z131 Encounter for screening for diabetes mellitus: Secondary | ICD-10-CM

## 2022-04-06 DIAGNOSIS — Z0000 Encounter for general adult medical examination without abnormal findings: Secondary | ICD-10-CM

## 2022-04-10 NOTE — Progress Notes (Signed)
Erroneous encounter-disregard

## 2022-04-18 ENCOUNTER — Encounter: Payer: 59 | Admitting: Family

## 2022-04-18 DIAGNOSIS — Z1231 Encounter for screening mammogram for malignant neoplasm of breast: Secondary | ICD-10-CM

## 2022-04-18 DIAGNOSIS — Z13228 Encounter for screening for other metabolic disorders: Secondary | ICD-10-CM

## 2022-04-18 DIAGNOSIS — Z0000 Encounter for general adult medical examination without abnormal findings: Secondary | ICD-10-CM

## 2022-04-18 DIAGNOSIS — Z130 Encounter for screening for diseases of the blood and blood-forming organs and certain disorders involving the immune mechanism: Secondary | ICD-10-CM

## 2022-04-18 DIAGNOSIS — Z13220 Encounter for screening for lipoid disorders: Secondary | ICD-10-CM

## 2022-04-18 DIAGNOSIS — Z1329 Encounter for screening for other suspected endocrine disorder: Secondary | ICD-10-CM

## 2022-04-18 DIAGNOSIS — Z131 Encounter for screening for diabetes mellitus: Secondary | ICD-10-CM

## 2022-09-20 NOTE — Telephone Encounter (Signed)
See routing comment(s) for message information.

## 2022-10-02 ENCOUNTER — Ambulatory Visit
Admission: EM | Admit: 2022-10-02 | Discharge: 2022-10-02 | Disposition: A | Discharge status: Home / Self Care | Payer: 59 | Attending: Internal Medicine | Admitting: Internal Medicine

## 2022-10-02 DIAGNOSIS — H66002 Acute suppurative otitis media without spontaneous rupture of ear drum, left ear: Secondary | ICD-10-CM | POA: Diagnosis not present

## 2022-10-02 DIAGNOSIS — J0100 Acute maxillary sinusitis, unspecified: Secondary | ICD-10-CM

## 2022-10-02 MED ORDER — AMOXICILLIN-POT CLAVULANATE 875-125 MG PO TABS: 1.0000 | ORAL_TABLET | Freq: Two times a day (BID) | ORAL | 0 refills | Status: DC

## 2022-10-02 NOTE — ED Provider Notes (Signed)
[facility]    CSN: [id number] Arrival date & time: 10/02/22  1823      History   Chief Complaint Chief Complaint  Patient presents with   Otalgia   Facial Pain    HPI Nichole Garcia is a 42 y.o. female.   Patient presents today with left ear pain and sinus pressure that started about a week ago.  Reports mild nasal mucus as well.  Denies coughing or fever.  Denies any known sick contacts.  Patient has taken over-the-counter cold and flu medication with minimal improvement of symptoms.   Otalgia   Past Medical History:  Diagnosis Date   Acid reflux    Migraines     Patient Active Problem List   Diagnosis Date Noted   Bacterial vaginitis 01/20/2021   Prediabetes 12/30/2020   Chondromalacia patellae 11/28/2020   Internal derangement of right knee 11/28/2020   Essential hypertension 11/28/2020   Anxiety and depression 11/08/2020    Past Surgical History:  Procedure Laterality Date   TUBAL LIGATION      OB History   No obstetric history on file.      Home Medications    Prior to Admission medications   Medication Sig Start Date End Date Taking? Authorizing Provider  amoxicillin-clavulanate (AUGMENTIN) 875-125 MG tablet Take 1 tablet by mouth every 12 (twelve) hours. 10/02/22  Yes Zakk Borgen, Acie Fredrickson, FNP  albuterol (VENTOLIN HFA) 108 (90 Base) MCG/ACT inhaler Inhale 1-2 puffs into the lungs every 6 (six) hours as needed for wheezing or shortness of breath. 03/01/22   Gustavus Bryant, FNP  diphenhydrAMINE (BENADRYL) 25 MG tablet Take 25 mg by mouth at bedtime as needed.    [provider]  doxycycline (VIBRAMYCIN) 100 MG capsule Take 1 capsule (100 mg total) by mouth 2 (two) times daily. Patient not taking: Reported on 03/01/2022 10/27/21   Gareth Eagle, PA-C  escitalopram (LEXAPRO) 20 MG tablet Take 1 tablet (20 mg total) by mouth daily. Patient not taking: Reported on 03/01/2022 12/29/20 03/29/21  [name], NP   HYDROcodone-acetaminophen (NORCO/VICODIN) 5-325 MG tablet Take 1 tablet by mouth every 4 (four) hours as needed. Patient not taking: Reported on 03/01/2022 10/27/21   Gareth Eagle, PA-C  omeprazole (PRILOSEC) 20 MG capsule Take 1 capsule (20 mg total) by mouth daily. Patient not taking: Reported on 03/01/2022 08/13/20   Raspet, Noberto Retort, PA-C    Family History Family History  Problem Relation Age of Onset   Hypertension Mother    Diabetes Mother     Social History Social History   Tobacco Use   Smoking status: Never   Smokeless tobacco: Never  Vaping Use   Vaping status: Never Used  Substance Use Topics   Alcohol use: Yes    Comment: occasionally on weekends   Drug use: Yes    Types: Marijuana     Allergies   Bee venom and Asa [aspirin]   Review of Systems Review of Systems Per HPI  Physical Exam Triage Vital Signs ED Triage Vitals [10/02/22 1859]  Encounter Vitals Group     BP (!) 137/95     Systolic BP Percentile      Diastolic BP Percentile      Pulse Rate 86     Resp 16     Temp 99.2 F (37.3 C)     Temp Source Oral     SpO2 98 %     Weight      Height  Head Circumference      Peak Flow      Pain Score      Pain Loc      Pain Education      Exclude from Growth Chart    No data found.  Updated Vital Signs BP (!) 137/95 (BP Location: Left Arm)   Pulse 86   Temp 99.2 F (37.3 C) (Oral)   Resp 16   LMP 09/30/2022   SpO2 98%   Visual Acuity Right Eye Distance:   Left Eye Distance:   Bilateral Distance:    Right Eye Near:   Left Eye Near:    Bilateral Near:     Physical Exam Constitutional:      General: She is not in acute distress.    Appearance: Normal appearance. She is not toxic-appearing or diaphoretic.  HENT:     Head: Normocephalic and atraumatic.     Right Ear: Tympanic membrane and ear canal normal.     Left Ear: Ear canal normal. Tympanic membrane is erythematous. Tympanic membrane is not perforated or bulging.      Ears:     Comments: Erythema and purulent discoloration present to the left TM.    Nose: Congestion present.     Mouth/Throat:     Mouth: Mucous membranes are moist.     Pharynx: No posterior oropharyngeal erythema.  Eyes:     Extraocular Movements: Extraocular movements intact.     Conjunctiva/sclera: Conjunctivae normal.     Pupils: Pupils are equal, round, and reactive to light.  Cardiovascular:     Rate and Rhythm: Normal rate and regular rhythm.     Pulses: Normal pulses.     Heart sounds: Normal heart sounds.  Pulmonary:     Effort: Pulmonary effort is normal. No respiratory distress.     Breath sounds: Normal breath sounds. No stridor. No wheezing, rhonchi or rales.  Abdominal:     General: Abdomen is flat. Bowel sounds are normal.     Palpations: Abdomen is soft.  Musculoskeletal:        General: Normal range of motion.     Cervical back: Normal range of motion.  Skin:    General: Skin is warm and dry.  Neurological:     General: No focal deficit present.     Mental Status: She is alert and oriented to person, place, and time. Mental status is at baseline.  Psychiatric:        Mood and Affect: Mood normal.        Behavior: Behavior normal.      UC Treatments / Results  Labs (all labs ordered are listed, but only abnormal results are displayed) Labs Reviewed - No data to display  EKG   Radiology No results found.  Procedures Procedures (including critical care time)  Medications Ordered in UC Medications - No data to display  Initial Impression / Assessment and Plan / UC Course  I have reviewed the triage vital signs and the nursing notes.  Pertinent labs & imaging results that were available during my care of the patient were reviewed by me and considered in my medical decision making (see chart for details).     Will treat with Augmentin for sinus infection and left otitis media.  Advised supportive care and strict follow-up if any symptoms persist  or worsen.  Patient verbalized understanding and was agreeable with plan. Final Clinical Impressions(s) / UC Diagnoses   Final diagnoses:  Non-recurrent acute suppurative otitis media of  left ear without spontaneous rupture of tympanic membrane  Acute non-recurrent maxillary sinusitis     Discharge Instructions      I have sent an antibiotic for upper respiratory infection and left ear infection.  Please follow up if any symptoms persist or worsen.    ED Prescriptions     Medication Sig Dispense Auth. Provider   amoxicillin-clavulanate (AUGMENTIN) 875-125 MG tablet Take 1 tablet by mouth every 12 (twelve) hours. 14 tablet [place], Acie Fredrickson, [place]      PDMP not reviewed this encounter.   Gustavus Bryant, [place] 10/02/22 1940

## 2022-10-02 NOTE — Discharge Instructions (Signed)
I have sent an antibiotic for upper respiratory infection and left ear infection.  Please follow up if any symptoms persist or worsen.

## 2022-10-02 NOTE — ED Triage Notes (Signed)
Pt is here for ear pain and sinus pressure x 1 week.

## 2023-02-08 ENCOUNTER — Telehealth: Payer: Self-pay | Admitting: Family

## 2023-02-08 NOTE — Telephone Encounter (Signed)
Copied from CRM [id number]. Topic: Appointments - Scheduling Inquiry for Clinic >> Feb 07, 2023  4:16 PM Nichole Garcia wrote: Reason for CRM: Patient calling requesting to schedule appointment for pap smear and mammogram same day if possible Thank You

## 2023-02-08 NOTE — Telephone Encounter (Signed)
Called pt and left vm to call office back to schedule appt. Provider is not in the office today; next available appt on 02/07

## 2023-02-11 ENCOUNTER — Ambulatory Visit
Admission: EM | Admit: 2023-02-11 | Discharge: 2023-02-11 | Disposition: A | Discharge status: Home / Self Care | Payer: 59 | Attending: Family Medicine | Admitting: Family Medicine

## 2023-02-11 DIAGNOSIS — B9789 Other viral agents as the cause of diseases classified elsewhere: Secondary | ICD-10-CM

## 2023-02-11 DIAGNOSIS — Z20822 Contact with and (suspected) exposure to covid-19: Secondary | ICD-10-CM

## 2023-02-11 DIAGNOSIS — J988 Other specified respiratory disorders: Secondary | ICD-10-CM | POA: Diagnosis not present

## 2023-02-11 DIAGNOSIS — F1290 Cannabis use, unspecified, uncomplicated: Secondary | ICD-10-CM

## 2023-02-11 DIAGNOSIS — Z20828 Contact with and (suspected) exposure to other viral communicable diseases: Secondary | ICD-10-CM | POA: Diagnosis not present

## 2023-02-11 DIAGNOSIS — U071 COVID-19: Secondary | ICD-10-CM

## 2023-02-11 LAB — POC COVID19/FLU A&B COMBO
Covid Antigen, POC: POSITIVE — AB
Influenza A Antigen, POC: NEGATIVE
Influenza B Antigen, POC: NEGATIVE

## 2023-02-11 MED ORDER — PAXLOVID (300/100) 20 X 150 MG & 10 X 100MG PO TBPK: ORAL_TABLET | ORAL | 0 refills | Status: DC

## 2023-02-11 MED ORDER — CETIRIZINE HCL 10 MG PO TABS: 10.0000 mg | ORAL_TABLET | Freq: Every day | ORAL | 0 refills | Status: AC

## 2023-02-11 MED ORDER — PREDNISONE 20 MG PO TABS: ORAL_TABLET | ORAL | 0 refills | Status: DC

## 2023-02-11 MED ORDER — PROMETHAZINE-DM 6.25-15 MG/5ML PO SYRP: 5.0000 mL | ORAL_SOLUTION | Freq: Three times a day (TID) | ORAL | 0 refills | Status: DC | PRN

## 2023-02-11 NOTE — ED Triage Notes (Signed)
Pt c/o cough, head/chest congestion, n/v/chills, body aches-sx started 1/24-states she had +covid and +flu exposure-NAD-steady gait

## 2023-02-11 NOTE — Discharge Instructions (Addendum)
For sore throat or cough try using a honey-based tea. Use 3 teaspoons of honey with juice squeezed from half lemon. Place shaved pieces of ginger into 1/2-1 cup of water and warm over stove top. Then mix the ingredients and repeat every 4 hours as needed. Please take Tylenol 500mg -650mg  once every 6 hours for fevers, aches and pains. Hydrate very well with at least 2 liters (64 ounces) of water. Eat light meals such as soups (chicken and noodles, chicken wild rice, vegetable).  Do not eat any foods that you are allergic to.  Start an antihistamine like Zyrtec (10mg  daily) for postnasal drainage, sinus congestion.  You can take this together with prednisone for a respiratory boost. Use albuterol inhaler as needed as well as cough syrup.

## 2023-02-11 NOTE — ED Provider Notes (Signed)
[facility]  Note:  This document was prepared using Conservation officer, historic buildings and may include unintentional dictation errors.  MRN: [id number] DOB: 1980-05-17  Subjective:   Nichole Garcia is a 44 y.o. female presenting for 3-day history of acute onset persistent coughing, sinus congestion, chest congestion, body pains, chest discomfort with coughing, nausea, vomiting, chills.  Had exposure to COVID and flu.  Has previously had bronchitis and would like prednisone.  Has an albuterol inhaler. Smokes marijuana daily.  No history of kidney disease.  No prior medications.  Allergies  Allergen Reactions   Bee Venom Shortness Of Breath and Swelling   Asa [Aspirin] Other (See Comments)    "Makes me feel funny"    Past Medical History:  Diagnosis Date   Acid reflux    Migraines      Past Surgical History:  Procedure Laterality Date   TUBAL LIGATION      Family History  Problem Relation Age of Onset   Hypertension Mother    Diabetes Mother     Social History   Tobacco Use   Smoking status: Never   Smokeless tobacco: Never  Vaping Use   Vaping status: Never Used  Substance Use Topics   Alcohol use: Yes    Comment: occasionally on weekends   Drug use: Yes    Types: Marijuana    ROS   Objective:   Vitals: BP (!) 149/98 (BP Location: Left Arm)   Pulse 78   Temp 98.9 F (37.2 C) (Oral)   Resp 16   LMP 02/11/2023   SpO2 99%   Physical Exam Constitutional:      General: She is not in acute distress.    Appearance: Normal appearance. She is well-developed and normal weight. She is ill-appearing. She is not toxic-appearing or diaphoretic.  HENT:     Head: Normocephalic and atraumatic.     Right Ear: Tympanic membrane, ear canal and external ear normal. No drainage or tenderness. No middle ear effusion. There is no impacted cerumen. Tympanic membrane is not erythematous or bulging.     Left Ear: Tympanic membrane, ear canal and  external ear normal. No drainage or tenderness.  No middle ear effusion. There is no impacted cerumen. Tympanic membrane is not erythematous or bulging.     Nose: Congestion present. No rhinorrhea.     Mouth/Throat:     Mouth: Mucous membranes are moist. No oral lesions.     Pharynx: No pharyngeal swelling, oropharyngeal exudate, posterior oropharyngeal erythema or uvula swelling.     Tonsils: No tonsillar exudate or tonsillar abscesses.  Eyes:     General: No scleral icterus.       Right eye: No discharge.        Left eye: No discharge.     Extraocular Movements: Extraocular movements intact.     Right eye: Normal extraocular motion.     Left eye: Normal extraocular motion.     Conjunctiva/sclera: Conjunctivae normal.  Cardiovascular:     Rate and Rhythm: Normal rate and regular rhythm.     Heart sounds: Normal heart sounds. No murmur heard.    No friction rub. No gallop.  Pulmonary:     Effort: Pulmonary effort is normal. No respiratory distress.     Breath sounds: No stridor. No wheezing, rhonchi or rales.  Chest:     Chest wall: No tenderness.  Musculoskeletal:     Cervical back: Normal range of motion and neck supple.  Lymphadenopathy:  Cervical: No cervical adenopathy.  Skin:    General: Skin is warm and dry.  Neurological:     General: No focal deficit present.     Mental Status: She is alert and oriented to person, place, and time.  Psychiatric:        Mood and Affect: Mood normal.        Behavior: Behavior normal.     Results for orders placed or performed during the hospital encounter of 02/11/23 (from the past 24 hours)  POC Covid19/Flu A&B Antigen     Status: Abnormal   Collection Time: 02/11/23  2:14 PM  Result Value Ref Range   Influenza A Antigen, POC Negative Negative   Influenza B Antigen, POC Negative Negative   Covid Antigen, POC Positive (A) Negative    Assessment and Plan :   PDMP not reviewed this encounter.  1. COVID-19   2. Viral  respiratory illness   3. Close exposure to COVID-19 virus   4. Exposure to influenza   5. Marijuana use    Patient requested Paxlovid and I am in agreement given her risk factors.  Recommended of prednisone respiratory boost which the patient also requested.  Continue with supportive care otherwise.  Counseled patient on potential for adverse effects with medications prescribed/recommended today, ER and return-to-clinic precautions discussed, patient verbalized understanding.    Wallis Bamberg, [place] 02/11/23 1424

## 2023-02-18 ENCOUNTER — Ambulatory Visit
Admission: RE | Admit: 2023-02-18 | Discharge: 2023-02-18 | Disposition: A | Discharge status: Home / Self Care | Payer: 59 | Source: Ambulatory Visit | Attending: Family | Admitting: Family

## 2023-02-18 VITALS — BP 153/101 | HR 105 | Temp 98.7°F | Resp 16 | Ht 64.0 in | Wt 160.0 lb

## 2023-02-18 DIAGNOSIS — U099 Post covid-19 condition, unspecified: Secondary | ICD-10-CM | POA: Diagnosis not present

## 2023-02-18 NOTE — Discharge Instructions (Addendum)
For symptoms of chest tightness and shortness of breath as discussed continue consistent use of albuterol inhaler, avoid smoking until your symptoms resolved.  You are medically cleared to return back to work.

## 2023-02-18 NOTE — ED Triage Notes (Signed)
Patient here today with c/o cough, SOB, fatigue, and nasal congestion X 10 days. Patient was seen on 02/11/2023 and tested positive for Covid. Patient states that she has improved some.

## 2023-02-18 NOTE — ED Provider Notes (Signed)
[facility]    CSN: [id number] Arrival date & time: 02/18/23  1139      History   Chief Complaint Chief Complaint  Patient presents with   Cough    HPI Nichole Garcia is a 43 y.o. female.   Patient here for evaluation of cough, shortness of breath, nasal congestion x 10 days,  Here in clinic tested positive for COVID on 02/11/2023.  Reports she feels better than she did on the day when she was seen here in clinic however has not gotten back to her regular baseline.  Endorses cough and chest tightness when coughing and deep breathing.  She admits to poor adherence to use of the albuterol.  Is also inquiring about when it would be safe for her to return back to work. Past Medical History:  Diagnosis Date   Acid reflux    Migraines     Patient Active Problem List   Diagnosis Date Noted   Bacterial vaginitis 01/20/2021   Prediabetes 12/30/2020   Chondromalacia patellae 11/28/2020   Internal derangement of right knee 11/28/2020   Essential hypertension 11/28/2020   Anxiety and depression 11/08/2020    Past Surgical History:  Procedure Laterality Date   TUBAL LIGATION      OB History   No obstetric history on file.      Home Medications    Prior to Admission medications   Medication Sig Start Date End Date Taking? Authorizing Provider  albuterol (VENTOLIN HFA) 108 (90 Base) MCG/ACT inhaler Inhale 1-2 puffs into the lungs every 6 (six) hours as needed for wheezing or shortness of breath. 03/01/22   Gustavus Bryant, FNP  cetirizine (ZYRTEC ALLERGY) 10 MG tablet Take 1 tablet (10 mg total) by mouth daily. 02/11/23   Wallis Bamberg, PA-C  nirmatrelvir/ritonavir (PAXLOVID, 300/100,) 20 x 150 MG & 10 x 100MG  TBPK Take 2 tablets nirmtrelvir and 1 tablet ritonavir twice daily. 02/11/23   Wallis Bamberg, PA-C  promethazine-dextromethorphan (PROMETHAZINE-DM) 6.25-15 MG/5ML syrup Take 5 mLs by mouth 3 (three) times daily as needed for cough. 02/11/23   Wallis Bamberg, PA-C    Family  History Family History  Problem Relation Age of Onset   Hypertension Mother    Diabetes Mother     Social History Social History   Tobacco Use   Smoking status: Never   Smokeless tobacco: Never  Vaping Use   Vaping status: Never Used  Substance Use Topics   Alcohol use: Yes    Comment: occ   Drug use: Yes    Types: Marijuana     Allergies   Bee venom and Asa [aspirin]   Review of Systems Review of Systems  Respiratory:  Positive for cough.      Physical Exam Triage Vital Signs ED Triage Vitals  Encounter Vitals Group     BP 02/18/23 1236 (!) 153/101     Systolic BP Percentile --      Diastolic BP Percentile --      Pulse Rate 02/18/23 1236 (!) 105     Resp 02/18/23 1236 16     Temp 02/18/23 1236 98.7 F (37.1 C)     Temp Source 02/18/23 1236 Oral     SpO2 02/18/23 1236 97 %     Weight 02/18/23 1236 160 lb (72.6 kg)     Height 02/18/23 1236 5\' 4"  (1.626 m)     Head Circumference --      Peak Flow --      Pain Score 02/18/23  1239 0     Pain Loc --      Pain Education --      Exclude from Growth Chart --    No data found.  Updated Vital Signs BP (!) 153/101 (BP Location: Left Arm)   Pulse (!) 105   Temp 98.7 F (37.1 C) (Oral)   Resp 16   Ht 5\' 4"  (1.626 m)   Wt 160 lb (72.6 kg)   LMP 02/11/2023 (Exact Date)   SpO2 97%   BMI 27.46 kg/m   Visual Acuity Right Eye Distance:   Left Eye Distance:   Bilateral Distance:    Right Eye Near:   Left Eye Near:    Bilateral Near:     Physical Exam Vitals reviewed.  Constitutional:      Appearance: Normal appearance.  HENT:     Head: Normocephalic and atraumatic.     Nose: No congestion or rhinorrhea.  Eyes:     Extraocular Movements: Extraocular movements intact.     Pupils: Pupils are equal, round, and reactive to light.  Cardiovascular:     Rate and Rhythm: Normal rate and regular rhythm.  Pulmonary:     Effort: Pulmonary effort is normal.     Breath sounds: Decreased air movement  present. No wheezing or rhonchi.  Musculoskeletal:        General: Normal range of motion.     Cervical back: Normal range of motion and neck supple.  Skin:    General: Skin is warm and dry.  Neurological:     General: No focal deficit present.     Mental Status: She is alert and oriented to person, place, and time.      UC Treatments / Results  Labs (all labs ordered are listed, but only abnormal results are displayed) Labs Reviewed - No data to display  EKG   Radiology No results found.  Procedures Procedures (including critical care time)  Medications Ordered in UC Medications - No data to display  Initial Impression / Assessment and Plan / UC Course  I have reviewed the triage vital signs and the nursing notes.  Pertinent labs & imaging results that were available during my care of the patient were reviewed by me and considered in my medical decision making (see chart for details).    Patient continues to have some postop acute COVID symptoms although in general patient is well-appearing.  Patient encouraged to consistently use albuterol inhaler to improve work of breathing.  Also encouraged to discontinue smoking until respiratory symptoms completely resolve.  Return to work note given. Final Clinical Impressions(s) / UC Diagnoses   Final diagnoses:  Post-COVID syndrome     Discharge Instructions      For symptoms of chest tightness and shortness of breath as discussed continue consistent use of albuterol inhaler, avoid smoking until your symptoms resolved.  You are medically cleared to return back to work.     ED Prescriptions   None    PDMP not reviewed this encounter.   Bing Neighbors, NP 02/18/23 [phone]

## 2023-02-22 ENCOUNTER — Other Ambulatory Visit (HOSPITAL_COMMUNITY)
Admission: RE | Admit: 2023-02-22 | Discharge: 2023-02-22 | Disposition: A | Discharge status: Home / Self Care | Payer: Medicaid Other | Source: Ambulatory Visit | Attending: Family | Admitting: Family

## 2023-02-22 ENCOUNTER — Other Ambulatory Visit: Payer: Self-pay | Admitting: Family

## 2023-02-22 ENCOUNTER — Ambulatory Visit
Admission: RE | Admit: 2023-02-22 | Discharge: 2023-02-22 | Disposition: A | Discharge status: Home / Self Care | Payer: Medicaid Other | Source: Ambulatory Visit | Attending: Family | Admitting: Family

## 2023-02-22 ENCOUNTER — Ambulatory Visit (INDEPENDENT_AMBULATORY_CARE_PROVIDER_SITE_OTHER): Payer: 59 | Admitting: Family

## 2023-02-22 VITALS — BP 135/89 | HR 81 | Temp 98.1°F | Resp 16 | Ht 64.0 in | Wt 162.6 lb

## 2023-02-22 DIAGNOSIS — Z1231 Encounter for screening mammogram for malignant neoplasm of breast: Secondary | ICD-10-CM

## 2023-02-22 DIAGNOSIS — Z113 Encounter for screening for infections with a predominantly sexual mode of transmission: Secondary | ICD-10-CM

## 2023-02-22 DIAGNOSIS — Z114 Encounter for screening for human immunodeficiency virus [HIV]: Secondary | ICD-10-CM | POA: Diagnosis not present

## 2023-02-22 DIAGNOSIS — Z124 Encounter for screening for malignant neoplasm of cervix: Secondary | ICD-10-CM | POA: Diagnosis not present

## 2023-02-22 NOTE — Progress Notes (Signed)
 Patient is here for her Medical Management appt with no concerns. Patient would like a referral  for a mammogram

## 2023-02-22 NOTE — Progress Notes (Signed)
 Patient ID: Nichole Garcia, female    DOB: 06/27/1980  MRN: [id number]  CC: Pap Smear   Subjective: Nichole Garcia is a 43 y.o. female who presents for pap smear.   Her concerns today include:  Patient presents for mammogram and pap smear. No further issues/concerns for discussion today.  Patient Active Problem List   Diagnosis Date Noted   Bacterial vaginitis 01/20/2021   Prediabetes 12/30/2020   Chondromalacia patellae 11/28/2020   Internal derangement of right knee 11/28/2020   Essential hypertension 11/28/2020   Anxiety and depression 11/08/2020     Current Outpatient Medications on File Prior to Visit  Medication Sig Dispense Refill   albuterol  (VENTOLIN  HFA) 108 (90 Base) MCG/ACT inhaler Inhale 1-2 puffs into the lungs every 6 (six) hours as needed for wheezing or shortness of breath. (Patient not taking: Reported on 02/22/2023) 1 each 0   cetirizine  (ZYRTEC  ALLERGY) 10 MG tablet Take 1 tablet (10 mg total) by mouth daily. (Patient not taking: Reported on 02/22/2023) 30 tablet 0   nirmatrelvir/ritonavir (PAXLOVID , 300/100,) 20 x 150 MG & 10 x 100MG  TBPK Take 2 tablets nirmtrelvir and 1 tablet ritonavir twice daily. (Patient not taking: Reported on 02/22/2023) 30 tablet 0   promethazine -dextromethorphan (PROMETHAZINE -DM) 6.25-15 MG/5ML syrup Take 5 mLs by mouth 3 (three) times daily as needed for cough. (Patient not taking: Reported on 02/22/2023) 200 mL 0   No current facility-administered medications on file prior to visit.    Allergies  Allergen Reactions   Bee Venom Shortness Of Breath and Swelling   Asa [Aspirin] Other (See Comments)    Makes me feel funny   Penicillin  G Benzathine     Social History   Socioeconomic History   Marital status: Single    Spouse name: Not on file   Number of children: Not on file   Years of education: Not on file   Highest education level: Not on file  Occupational History   Not on file  Tobacco Use   Smoking status: Never    Smokeless tobacco: Never  Vaping Use   Vaping status: Never Used  Substance and Sexual Activity   Alcohol use: Yes    Comment: occ   Drug use: Yes    Types: Marijuana   Sexual activity: Not on file  Other Topics Concern   Not on file  Social History Narrative   Not on file   Social Drivers of Health   Financial Resource Strain: Not on file  Food Insecurity: Not on file  Transportation Needs: Not on file  Physical Activity: Not on file  Stress: Not on file  Social Connections: Unknown (05/30/2021)   Received from [facility]   Social Network    Social Network: Not on file  Intimate Partner Violence: Unknown (04/21/2021)   Received from [facility], [facility]   HITS    Physically Hurt: Not on file    Insult or Talk Down To: Not on file    Threaten Physical Harm: Not on file    Scream or Curse: Not on file    Family History  Problem Relation Age of Onset   Hypertension Mother    Diabetes Mother     Past Surgical History:  Procedure Laterality Date   TUBAL LIGATION      ROS: Review of Systems Negative except as stated above  PHYSICAL EXAM: BP 135/89   Pulse 81   Temp 98.1 F (36.7 C) (Oral)   Resp 16  Ht 5' 4 (1.626 m)   Wt 162 lb 9.6 oz (73.8 kg)   LMP 02/11/2023 (Exact Date)   SpO2 95%   BMI 27.91 kg/m   Physical Exam HENT:     Head: Normocephalic and atraumatic.     Nose: Nose normal.     Mouth/Throat:     Mouth: Mucous membranes are moist.     Pharynx: Oropharynx is clear.  Eyes:     Extraocular Movements: Extraocular movements intact.     Conjunctiva/sclera: Conjunctivae normal.     Pupils: Pupils are equal, round, and reactive to light.  Cardiovascular:     Rate and Rhythm: Normal rate and regular rhythm.     Pulses: Normal pulses.     Heart sounds: Normal heart sounds.  Pulmonary:     Effort: Pulmonary effort is normal.     Breath sounds: Normal breath sounds.  Chest:     Comments: Patient declined.   Genitourinary:    General: Normal vulva.     Vagina: Normal.     Cervix: Normal.     Uterus: Normal.      Adnexa: Right adnexa normal and left adnexa normal.     Comments: Curtistine Quiet, CMA present. Musculoskeletal:        General: Normal range of motion.     Cervical back: Normal range of motion and neck supple.  Neurological:     General: No focal deficit present.     Mental Status: She is alert and oriented to person, place, and time.  Psychiatric:        Mood and Affect: Mood normal.        Behavior: Behavior normal.    ASSESSMENT AND PLAN: 1. Encounter for screening mammogram for malignant neoplasm of breast (Primary) - Routine screening.  - MM Digital Screening; Future  2. Pap smear for cervical cancer screening - Routine screening.  - Cytology - PAP[place]  3. Routine screening for STI (sexually transmitted infection) - Routine screening.  - Cervicovaginal ancillary only  4. Encounter for screening for HIV - Routine screening.  - HIV antibody (with reflex)    Patient was given the opportunity to ask questions.  Patient verbalized understanding of the plan and was able to repeat key elements of the plan. Patient was given clear instructions to go to Emergency Department or return to medical center if symptoms don't improve, worsen, or new problems develop.The patient verbalized understanding.   Orders Placed This Encounter  Procedures   MM Digital Screening   HIV antibody (with reflex)    Follow-up with primary provider as scheduled.   Greig JINNY Drones, NP

## 2023-02-23 LAB — HIV ANTIBODY (ROUTINE TESTING W REFLEX): HIV Screen 4th Generation wRfx: NONREACTIVE

## 2023-02-25 ENCOUNTER — Encounter: Payer: Self-pay | Admitting: Family

## 2023-02-25 LAB — CERVICOVAGINAL ANCILLARY ONLY
Bacterial Vaginitis (gardnerella): POSITIVE — AB
Candida Glabrata: NEGATIVE
Candida Vaginitis: NEGATIVE
Chlamydia: NEGATIVE
Comment: NEGATIVE
Comment: NEGATIVE
Comment: NEGATIVE
Comment: NEGATIVE
Comment: NEGATIVE
Comment: NORMAL
Neisseria Gonorrhea: NEGATIVE
Trichomonas: NEGATIVE

## 2023-02-26 ENCOUNTER — Encounter: Payer: Self-pay | Admitting: Family

## 2023-02-26 ENCOUNTER — Other Ambulatory Visit: Payer: Self-pay | Admitting: Family

## 2023-02-26 DIAGNOSIS — B9689 Other specified bacterial agents as the cause of diseases classified elsewhere: Secondary | ICD-10-CM

## 2023-02-26 MED ORDER — METRONIDAZOLE 500 MG PO TABS: 500.0000 mg | ORAL_TABLET | Freq: Two times a day (BID) | ORAL | 0 refills | Status: AC

## 2023-03-01 LAB — CYTOLOGY - PAP
Adequacy: ABSENT
Comment: NEGATIVE
Diagnosis: NEGATIVE
High risk HPV: NEGATIVE

## 2023-03-06 ENCOUNTER — Ambulatory Visit
Admission: EM | Admit: 2023-03-06 | Discharge: 2023-03-06 | Disposition: A | Discharge status: Home / Self Care | Payer: 59 | Attending: Physician Assistant | Admitting: Physician Assistant

## 2023-03-06 DIAGNOSIS — K529 Noninfective gastroenteritis and colitis, unspecified: Secondary | ICD-10-CM | POA: Diagnosis not present

## 2023-03-06 MED ORDER — ONDANSETRON 4 MG PO TBDP: 4.0000 mg | ORAL_TABLET | Freq: Three times a day (TID) | ORAL | 0 refills | Status: DC | PRN

## 2023-03-06 MED ORDER — ONDANSETRON 4 MG PO TBDP
4.0000 mg | ORAL_TABLET | Freq: Once | ORAL | Status: AC
  Administered 2023-03-06: 4 mg via ORAL

## 2023-03-06 NOTE — ED Provider Notes (Signed)
[facility]    CSN: [id number] Arrival date & time: 03/06/23  1526      History   Chief Complaint Chief Complaint  Patient presents with   Emesis    diar   Diarrhea   Back Pain    HPI Nichole Garcia is a 43 y.o. female.   Patient here today for evaluation of generalized abdominal pain, vomiting and diarrhea that started yesterday.  She reports she feels overall generally weak.  Stools are watery.  She denies any fever.  She does work at a daycare.  The history is provided by the patient.  Emesis Associated symptoms: abdominal pain and diarrhea   Associated symptoms: no chills, no cough and no fever   Diarrhea Associated symptoms: abdominal pain and vomiting   Associated symptoms: no chills and no fever   Back Pain Associated symptoms: abdominal pain   Associated symptoms: no fever     Past Medical History:  Diagnosis Date   Acid reflux    Migraines     Patient Active Problem List   Diagnosis Date Noted   Bacterial vaginitis 01/20/2021   Prediabetes 12/30/2020   Chondromalacia patellae 11/28/2020   Internal derangement of right knee 11/28/2020   Essential hypertension 11/28/2020   Anxiety and depression 11/08/2020    Past Surgical History:  Procedure Laterality Date   TUBAL LIGATION      OB History   No obstetric history on file.      Home Medications    Prior to Admission medications   Medication Sig Start Date End Date Taking? Authorizing Provider  cetirizine (ZYRTEC ALLERGY) 10 MG tablet Take 1 tablet (10 mg total) by mouth daily. 02/11/23  Yes Wallis Bamberg, PA-C  ondansetron (ZOFRAN-ODT) 4 MG disintegrating tablet Take 1 tablet (4 mg total) by mouth every 8 (eight) hours as needed. 03/06/23  Yes Tomi Bamberger, PA-C  albuterol (VENTOLIN HFA) 108 (90 Base) MCG/ACT inhaler Inhale 1-2 puffs into the lungs every 6 (six) hours as needed for wheezing or shortness of breath. Patient not taking: Reported on 02/22/2023 03/01/22   Gustavus Bryant,  FNP  nirmatrelvir/ritonavir (PAXLOVID, 300/100,) 20 x 150 MG & 10 x 100MG  TBPK Take 2 tablets nirmtrelvir and 1 tablet ritonavir twice daily. Patient not taking: Reported on 02/22/2023 02/11/23   Wallis Bamberg, PA-C  promethazine-dextromethorphan (PROMETHAZINE-DM) 6.25-15 MG/5ML syrup Take 5 mLs by mouth 3 (three) times daily as needed for cough. Patient not taking: Reported on 02/22/2023 02/11/23   Wallis Bamberg, PA-C    Family History Family History  Problem Relation Age of Onset   Hypertension Mother    Diabetes Mother     Social History Social History   Tobacco Use   Smoking status: Never   Smokeless tobacco: Never  Vaping Use   Vaping status: Never Used  Substance Use Topics   Alcohol use: Yes    Comment: Occassionally.   Drug use: Yes    Types: Marijuana     Allergies   Bee venom, Asa [aspirin], and Penicillin g benzathine   Review of Systems Review of Systems  Constitutional:  Negative for chills and fever.  HENT:  Negative for congestion.   Eyes:  Negative for discharge and redness.  Respiratory:  Negative for cough and shortness of breath.   Gastrointestinal:  Positive for abdominal pain, diarrhea, nausea and vomiting.     Physical Exam Triage Vital Signs ED Triage Vitals  Encounter Vitals Group     BP 03/06/23 1544 131/88  Systolic BP Percentile --      Diastolic BP Percentile --      Pulse Rate 03/06/23 1544 97     Resp 03/06/23 1544 18     Temp 03/06/23 1544 99 F (37.2 C)     Temp Source 03/06/23 1544 Oral     SpO2 03/06/23 1544 98 %     Weight 03/06/23 1543 162 lb 11.2 oz (73.8 kg)     Height 03/06/23 1543 5\' 4"  (1.626 m)     Head Circumference --      Peak Flow --      Pain Score 03/06/23 1538 10     Pain Loc --      Pain Education --      Exclude from Growth Chart --    No data found.  Updated Vital Signs BP 131/88 (BP Location: Left Arm)   Pulse 97   Temp 99 F (37.2 C) (Oral)   Resp 18   Ht 5\' 4"  (1.626 m)   Wt 162 lb 11.2 oz (73.8  kg)   LMP 02/11/2023 (Exact Date)   SpO2 98%   BMI 27.93 kg/m   Visual Acuity Right Eye Distance:   Left Eye Distance:   Bilateral Distance:    Right Eye Near:   Left Eye Near:    Bilateral Near:     Physical Exam Vitals and nursing note reviewed.  Constitutional:      General: She is not in acute distress.    Appearance: Normal appearance. She is not ill-appearing.  HENT:     Head: Normocephalic and atraumatic.     Nose: Nose normal. No congestion.  Eyes:     Conjunctiva/sclera: Conjunctivae normal.  Cardiovascular:     Rate and Rhythm: Normal rate and regular rhythm.  Pulmonary:     Effort: Pulmonary effort is normal. No respiratory distress.     Breath sounds: Normal breath sounds. No wheezing, rhonchi or rales.  Abdominal:     General: Abdomen is flat. Bowel sounds are normal. There is no distension.     Palpations: Abdomen is soft.     Tenderness: There is abdominal tenderness (mild diffuse lower abdominal TTP). There is no guarding or rebound.  Neurological:     Mental Status: She is alert.  Psychiatric:        Mood and Affect: Mood normal.        Behavior: Behavior normal.        Thought Content: Thought content normal.      UC Treatments / Results  Labs (all labs ordered are listed, but only abnormal results are displayed) Labs Reviewed - No data to display  EKG   Radiology No results found.  Procedures Procedures (including critical care time)  Medications Ordered in UC Medications  ondansetron (ZOFRAN-ODT) disintegrating tablet 4 mg (4 mg Oral Given 03/06/23 1547)    Initial Impression / Assessment and Plan / UC Course  I have reviewed the triage vital signs and the nursing notes.  Pertinent labs & imaging results that were available during my care of the patient were reviewed by me and considered in my medical decision making (see chart for details).    Zofran prescribed for suspected viral gastroenteritis and advised oral hydration with  electrolyte replacement.  Encouraged follow-up if no gradual improvement with treatment or with any further concerns.  Final Clinical Impressions(s) / UC Diagnoses   Final diagnoses:  Gastroenteritis   Discharge Instructions   None    ED  Prescriptions     Medication Sig Dispense Auth. Provider   ondansetron (ZOFRAN-ODT) 4 MG disintegrating tablet Take 1 tablet (4 mg total) by mouth every 8 (eight) hours as needed. 20 tablet Tomi Bamberger, PA-C      PDMP not reviewed this encounter.   Tomi Bamberger, PA-C 03/06/23 [phone]

## 2023-03-06 NOTE — ED Triage Notes (Signed)
"  This started with diarrhea yesterday & abd ache/pain, this kept up through the night keeping nausea, vomiting through the day today, I am just very weak now". Last void "1515". Stools "watery, last 1515". No fever.

## 2023-06-24 ENCOUNTER — Ambulatory Visit
Admission: RE | Admit: 2023-06-24 | Discharge: 2023-06-24 | Disposition: A | Discharge status: Home / Self Care | Source: Ambulatory Visit | Attending: Family Medicine | Admitting: Family Medicine

## 2023-06-24 VITALS — BP 152/106 | HR 87 | Temp 99.3°F | Resp 16

## 2023-06-24 DIAGNOSIS — Z113 Encounter for screening for infections with a predominantly sexual mode of transmission: Secondary | ICD-10-CM | POA: Insufficient documentation

## 2023-06-24 DIAGNOSIS — J329 Chronic sinusitis, unspecified: Secondary | ICD-10-CM | POA: Diagnosis not present

## 2023-06-24 DIAGNOSIS — B9689 Other specified bacterial agents as the cause of diseases classified elsewhere: Secondary | ICD-10-CM | POA: Insufficient documentation

## 2023-06-24 DIAGNOSIS — N920 Excessive and frequent menstruation with regular cycle: Secondary | ICD-10-CM | POA: Diagnosis not present

## 2023-06-24 LAB — POCT URINE PREGNANCY: Preg Test, Ur: NEGATIVE

## 2023-06-24 LAB — POCT URINALYSIS DIP (MANUAL ENTRY)
Bilirubin, UA: NEGATIVE
Blood, UA: NEGATIVE
Glucose, UA: NEGATIVE mg/dL
Ketones, POC UA: NEGATIVE mg/dL
Leukocytes, UA: NEGATIVE
Nitrite, UA: NEGATIVE
Protein Ur, POC: NEGATIVE mg/dL
Spec Grav, UA: 1.025 (ref 1.010–1.025)
Urobilinogen, UA: 1 U/dL
pH, UA: 7 (ref 5.0–8.0)

## 2023-06-24 MED ORDER — DOXYCYCLINE HYCLATE 100 MG PO CAPS: 100.0000 mg | ORAL_CAPSULE | Freq: Two times a day (BID) | ORAL | 0 refills | Status: AC

## 2023-06-24 NOTE — ED Provider Notes (Signed)
 [facility]    CSN: [id number] Arrival date & time: 06/24/23  1743      History   Chief Complaint Chief Complaint  Patient presents with   Exposure to STD    Entered by patient    HPI Nichole Garcia is a 43 y.o. female  presents for evaluation of URI symptoms for 2 weeks. Patient reports associated symptoms of sinus pressure/pain with purulent nasal discharge and aching teeth. Denies N/V/D, cough, fevers, ear pain, body aches, shortness of breath. Patient does not have a hx of asthma.  In addition she reports she has had some intermittent vaginal spotting between her periods.  Last menstrual cycle was 5/23.  States she is typically very irregular and does not have irregular.'s.  She is not on birth control.  Denies pregnancy.  Is concerned for STDs and would like screening.  No vaginal discharge that is abnormal.  Pt has taken sinus medication OTC for symptoms. Pt has no other concerns at this time.    Exposure to STD    Past Medical History:  Diagnosis Date   Acid reflux    Migraines     Patient Active Problem List   Diagnosis Date Noted   Bacterial vaginitis 01/20/2021   Prediabetes 12/30/2020   Chondromalacia patellae 11/28/2020   Internal derangement of right knee 11/28/2020   Essential hypertension 11/28/2020   Anxiety and depression 11/08/2020    Past Surgical History:  Procedure Laterality Date   TUBAL LIGATION      OB History   No obstetric history on file.      Home Medications    Prior to Admission medications   Medication Sig Start Date End Date Taking? Authorizing Provider  doxycycline  (VIBRAMYCIN ) 100 MG capsule Take 1 capsule (100 mg total) by mouth 2 (two) times daily for 7 days. 06/24/23 07/01/23 Yes Kyjuan Gause, Jodi R, NP  cetirizine  (ZYRTEC  ALLERGY) 10 MG tablet Take 1 tablet (10 mg total) by mouth daily. 02/11/23   Adolph Hoop, PA-C    Family History Family History  Problem Relation Age of Onset   Hypertension Mother    Diabetes  Mother     Social History Social History   Tobacco Use   Smoking status: Never   Smokeless tobacco: Never  Vaping Use   Vaping status: Never Used  Substance Use Topics   Alcohol use: Yes    Comment: Occassionally.   Drug use: Yes    Types: Marijuana     Allergies   Bee venom, Asa [aspirin], and Penicillin  g benzathine   Review of Systems Review of Systems  HENT:  Positive for congestion, sinus pressure and sinus pain.   Genitourinary:  Positive for menstrual problem.     Physical Exam Triage Vital Signs ED Triage Vitals  Encounter Vitals Group     BP 06/24/23 1814 (!) 152/106     Systolic BP Percentile --      Diastolic BP Percentile --      Pulse Rate 06/24/23 1814 87     Resp 06/24/23 1814 16     Temp 06/24/23 1814 99.3 F (37.4 C)     Temp Source 06/24/23 1814 Oral     SpO2 06/24/23 1814 98 %     Weight --      Height --      Head Circumference --      Peak Flow --      Pain Score 06/24/23 1815 5     Pain Loc --  Pain Education --      Exclude from Growth Chart --    No data found.  Updated Vital Signs BP (!) 152/106 (BP Location: Right Arm)   Pulse 87   Temp 99.3 F (37.4 C) (Oral)   Resp 16   LMP 06/07/2023 (Exact Date)   SpO2 98%   Visual Acuity Right Eye Distance:   Left Eye Distance:   Bilateral Distance:    Right Eye Near:   Left Eye Near:    Bilateral Near:     Physical Exam Vitals and nursing note reviewed.  Constitutional:      General: She is not in acute distress.    Appearance: She is well-developed. She is not ill-appearing.  HENT:     Head: Normocephalic and atraumatic.     Right Ear: Tympanic membrane and ear canal normal.     Left Ear: Tympanic membrane and ear canal normal.     Nose: Congestion present.     Right Turbinates: Swollen and pale.     Left Turbinates: Swollen and pale.     Right Sinus: Maxillary sinus tenderness present.     Left Sinus: Maxillary sinus tenderness present.     Mouth/Throat:      Mouth: Mucous membranes are moist.     Pharynx: Oropharynx is clear. Uvula midline. No posterior oropharyngeal erythema.     Tonsils: No tonsillar exudate or tonsillar abscesses.  Eyes:     Conjunctiva/sclera: Conjunctivae normal.     Pupils: Pupils are equal, round, and reactive to light.  Cardiovascular:     Rate and Rhythm: Normal rate and regular rhythm.     Heart sounds: Normal heart sounds.  Pulmonary:     Effort: Pulmonary effort is normal.     Breath sounds: Normal breath sounds. No wheezing or rhonchi.  Musculoskeletal:     Cervical back: Normal range of motion and neck supple.  Lymphadenopathy:     Cervical: No cervical adenopathy.  Skin:    General: Skin is warm and dry.  Neurological:     General: No focal deficit present.     Mental Status: She is alert and oriented to person, place, and time.  Psychiatric:        Mood and Affect: Mood normal.        Behavior: Behavior normal.      UC Treatments / Results  Labs (all labs ordered are listed, but only abnormal results are displayed) Labs Reviewed  POCT URINALYSIS DIP (MANUAL ENTRY) - Abnormal; Notable for the following components:      Result Value   Clarity, UA cloudy (*)    All other components within normal limits  RPR  HIV ANTIBODY (ROUTINE TESTING W REFLEX)  POCT URINE PREGNANCY  CERVICOVAGINAL ANCILLARY ONLY    EKG   Radiology No results found.  Procedures Procedures (including critical care time)  Medications Ordered in UC Medications - No data to display  Initial Impression / Assessment and Plan / UC Course  I have reviewed the triage vital signs and the nursing notes.  Pertinent labs & imaging results that were available during my care of the patient were reviewed by me and considered in my medical decision making (see chart for details).     Reviewed exam and symptoms with patient.  No red flags.  Vaginal swab/STD testing as ordered we will contact for any positive results.  Urine  negative for UTI and negative hCG.  Will start doxycycline  for sinusitis.  She will  also start Flonase which she has at home and encouraged nasal rinses as tolerated.  Advise rest fluids and PCP follow-up 2 to 3 days for recheck.  ER precautions reviewed and patient verbalized understanding. Final Clinical Impressions(s) / UC Diagnoses   Final diagnoses:  Screening examination for STD (sexually transmitted disease)  Spotting between menses  Bacterial sinusitis     Discharge Instructions      The clinic will contact you with results of the vaginal swab/STD testing done today if positive.  Start doxycycline  twice daily for 7 days.  Start Flonase daily and nasal rinses as you tolerate.  Lots of rest and fluids.  Please follow-up with your PCP in 2 to 3 days for recheck.  Please go to the ER for any worsening symptoms.  Hope you feel better soon!  ED Prescriptions     Medication Sig Dispense Auth. Provider   doxycycline  (VIBRAMYCIN ) 100 MG capsule Take 1 capsule (100 mg total) by mouth 2 (two) times daily for 7 days. 14 capsule Lamya Lausch, Jodi R, NP      PDMP not reviewed this encounter.   Alleen Arbour, NP 06/24/23 [phone]

## 2023-06-24 NOTE — ED Triage Notes (Signed)
 Patient here today with c/o vaginal spotting since last Wednesday. LMP 06/07/2023. Patient states that she had some concerns with her partner that made her want to get tested.   Patient has also been having some nasal congestion for over a week. She has been taking an OTC sinus medication with some relief.

## 2023-06-24 NOTE — Discharge Instructions (Signed)
 The clinic will contact you with results of the vaginal swab/STD testing done today if positive.  Start doxycycline  twice daily for 7 days.  Start Flonase daily and nasal rinses as you tolerate.  Lots of rest and fluids.  Please follow-up with your PCP in 2 to 3 days for recheck.  Please go to the ER for any worsening symptoms.  Hope you feel better soon!

## 2023-06-25 LAB — CERVICOVAGINAL ANCILLARY ONLY
Bacterial Vaginitis (gardnerella): POSITIVE — AB
Candida Glabrata: NEGATIVE
Candida Vaginitis: NEGATIVE
Chlamydia: NEGATIVE
Comment: NEGATIVE
Comment: NEGATIVE
Comment: NEGATIVE
Comment: NEGATIVE
Comment: NEGATIVE
Comment: NORMAL
Neisseria Gonorrhea: NEGATIVE
Trichomonas: NEGATIVE

## 2023-06-26 ENCOUNTER — Ambulatory Visit (HOSPITAL_COMMUNITY): Payer: Self-pay

## 2023-06-26 LAB — RPR: RPR Ser Ql: NONREACTIVE

## 2023-06-26 LAB — HIV ANTIBODY (ROUTINE TESTING W REFLEX): HIV Screen 4th Generation wRfx: NONREACTIVE

## 2023-07-03 ENCOUNTER — Encounter: Payer: Self-pay | Admitting: Emergency Medicine

## 2023-07-03 ENCOUNTER — Ambulatory Visit
Admission: EM | Admit: 2023-07-03 | Discharge: 2023-07-03 | Disposition: A | Discharge status: Home / Self Care | Attending: Nurse Practitioner | Admitting: Nurse Practitioner

## 2023-07-03 DIAGNOSIS — M898X1 Other specified disorders of bone, shoulder: Secondary | ICD-10-CM

## 2023-07-03 DIAGNOSIS — S29019A Strain of muscle and tendon of unspecified wall of thorax, initial encounter: Secondary | ICD-10-CM

## 2023-07-03 DIAGNOSIS — R030 Elevated blood-pressure reading, without diagnosis of hypertension: Secondary | ICD-10-CM

## 2023-07-03 MED ORDER — KETOROLAC TROMETHAMINE 30 MG/ML IJ SOLN
60.0000 mg | Freq: Once | INTRAMUSCULAR | Status: AC
  Administered 2023-07-03: 60 mg via INTRAMUSCULAR

## 2023-07-03 MED ORDER — NAPROXEN 500 MG PO TABS: 500.0000 mg | ORAL_TABLET | Freq: Two times a day (BID) | ORAL | 0 refills | Status: AC

## 2023-07-03 MED ORDER — DEXAMETHASONE SODIUM PHOSPHATE 10 MG/ML IJ SOLN
10.0000 mg | Freq: Once | INTRAMUSCULAR | Status: AC
  Administered 2023-07-03: 10 mg via INTRAMUSCULAR

## 2023-07-03 MED ORDER — CYCLOBENZAPRINE HCL 10 MG PO TABS: 10.0000 mg | ORAL_TABLET | Freq: Every day | ORAL | 0 refills | Status: AC

## 2023-07-03 MED ORDER — METHOCARBAMOL 500 MG PO TABS
500.0000 mg | ORAL_TABLET | Freq: Every morning | ORAL | 0 refills | Status: AC
Start: 2023-07-03 — End: ?

## 2023-07-03 NOTE — ED Provider Notes (Signed)
 [facility]    CSN: [id number] Arrival date & time: 07/03/23  1749      History   Chief Complaint Chief Complaint  Patient presents with   Back Pain    HPI Nichole Garcia is a 43 y.o. female.   Nichole Garcia is a 43 y.o. 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 The pain is located under the left shoulder blade, extending down the left side of the back to mid-back level, and radiating down the left arm to the fingers. The patient describes a funny sensation in the entire left arm, with some weakness, though she can still move her fingers and grab objects. There is also mild pain on the left side of the neck. The patient reports trying over-the-counter pain relief, including Tylenol  at night and a combination of Tylenol  and ibuprofen  during the day. Despite attempts to continue working, the patient's supervisor instructed her to seek medical attention at an urgent care or emergency room for work restrictions. The patient expresses difficulty finding a comfortable position due to the pain. The patient denies numbness in the affected area but mentions a weird feeling in her fingers. She reports being able to perform tasks such as grabbing and holding objects with her left hand, albeit with some discomfort.   The following portions of the patient's history were reviewed and updated as appropriate: allergies, current medications, past family history, past medical history, past social history, past surgical history, and problem list.     Past Medical History:  Diagnosis Date   Acid reflux    Migraines     Patient Active Problem List   Diagnosis Date Noted   Bacterial vaginitis 01/20/2021   Prediabetes 12/30/2020   Chondromalacia patellae 11/28/2020   Internal derangement of right knee 11/28/2020   Essential hypertension 11/28/2020   Anxiety and depression 11/08/2020     Past Surgical History:  Procedure Laterality Date   TUBAL LIGATION      OB History   No obstetric history on file.      Home Medications    Prior to Admission medications   Medication Sig Start Date End Date Taking? Authorizing Provider  cyclobenzaprine  (FLEXERIL ) 10 MG tablet Take 1 tablet (10 mg total) by mouth at bedtime. 07/03/23  Yes Maryruth Sol, FNP  methocarbamol (ROBAXIN) 500 MG tablet Take 1 tablet (500 mg total) by mouth every morning. 07/03/23  Yes Eve Rey, FNP  naproxen  (NAPROSYN ) 500 MG tablet Take 1 tablet (500 mg total) by mouth 2 (two) times daily with a meal. 07/03/23  Yes Maryruth Sol, FNP  cetirizine  (ZYRTEC  ALLERGY) 10 MG tablet Take 1 tablet (10 mg total) by mouth daily. 02/11/23   Adolph Hoop, PA-C    Family History Family History  Problem Relation Age of Onset   Hypertension Mother    Diabetes Mother     Social History Social History   Tobacco Use   Smoking status: Never    Passive exposure: Never   Smokeless tobacco: Never  Vaping Use   Vaping status: Never Used  Substance Use Topics   Alcohol use: Yes    Comment: Occassionally.   Drug use: Yes    Types: Marijuana     Allergies   Bee venom, Asa [aspirin], and Penicillin  g benzathine   Review of Systems Review of Systems  Musculoskeletal:  Positive for arthralgias and neck pain (mild, left sided). Negative for joint swelling  and neck stiffness.  Neurological:  Positive for weakness (a little --- no issues with grip or strength). Negative for numbness.  All other systems reviewed and are negative.    Physical Exam Triage Vital Signs ED Triage Vitals  Encounter Vitals Group     BP 07/03/23 1840 (!) 170/118     Girls Systolic BP Percentile --      Girls Diastolic BP Percentile --      Boys Systolic BP Percentile --      Boys Diastolic BP Percentile --      Pulse Rate 07/03/23 1840 82     Resp 07/03/23 1840 18     Temp 07/03/23 1840 98.5 F (36.9 C)      Temp Source 07/03/23 1840 Oral     SpO2 07/03/23 1840 100 %     Weight 07/03/23 1840 162 lb 13 oz (73.9 kg)     Height --      Head Circumference --      Peak Flow --      Pain Score 07/03/23 1839 7     Pain Loc --      Pain Education --      Exclude from Growth Chart --    No data found.  Updated Vital Signs BP (!) 170/118 (BP Location: Left Arm)   Pulse 82   Temp 98.5 F (36.9 C) (Oral)   Resp 18   Wt 162 lb 13 oz (73.9 kg)   LMP 06/30/2023 (Exact Date)   SpO2 100%   BMI 27.95 kg/m   Visual Acuity Right Eye Distance:   Left Eye Distance:   Bilateral Distance:    Right Eye Near:   Left Eye Near:    Bilateral Near:     Physical Exam Vitals reviewed.  Constitutional:      General: She is not in acute distress.    Appearance: Normal appearance. She is not ill-appearing, toxic-appearing or diaphoretic.  HENT:     Head: Normocephalic.     Mouth/Throat:     Mouth: Mucous membranes are moist.   Cardiovascular:     Rate and Rhythm: Normal rate and regular rhythm.  Pulmonary:     Effort: Pulmonary effort is normal.     Breath sounds: Normal breath sounds and air entry.  Abdominal:     Palpations: Abdomen is soft.     Tenderness: There is no right CVA tenderness or left CVA tenderness.   Musculoskeletal:        General: Normal range of motion.     Cervical back: Normal range of motion and neck supple. No rigidity. Pain with movement and muscular tenderness present. No spinous process tenderness. Normal range of motion.     Thoracic back: Tenderness present. No spasms. Normal range of motion.     Lumbar back: No swelling, deformity, lacerations, spasms or tenderness. Normal range of motion. Negative right straight leg raise test and negative left straight leg raise test.       Back:  Lymphadenopathy:     Cervical: No cervical adenopathy.   Skin:    General: Skin is warm and dry.   Neurological:     General: No focal deficit present.     Mental Status: She is  alert and oriented to person, place, and time.     Cranial Nerves: Cranial nerves 2-12 are intact.     Sensory: Sensation is intact.     Motor: Motor function is intact. No weakness.  Coordination: Coordination is intact.     Gait: Gait is intact.      UC Treatments / Results  Labs (all labs ordered are listed, but only abnormal results are displayed) Labs Reviewed - No data to display  EKG   Radiology No results found.  Procedures Procedures (including critical care time)  Medications Ordered in UC Medications  ketorolac (TORADOL) 30 MG/ML injection 60 mg (has no administration in time range)  dexamethasone (DECADRON) injection 10 mg (has no administration in time range)    Initial Impression / Assessment and Plan / UC Course  I have reviewed the triage vital signs and the nursing notes.  Pertinent labs & imaging results that were available during my care of the patient were reviewed by me and considered in my medical decision making (see chart for details).     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 She describes a "funny" sensation in the left arm with some mild weakness but no numbness. Neck pain is also present on the left side. The patient retains full hand function, including grip strength and finger movement. Exam findings and history are consistent with a muscular strain. Decadron and Toradol injections were administered in-office for pain relief. Robaxin was prescribed for daytime muscle spasms and Flexeril  for nighttime use. Naproxen  was prescribed twice daily for inflammation; the patient was advised to avoid aspirin and over-the-counter NSAIDs while taking naproxen . Ice and heat therapy were recommended to alternate throughout the day. The patient was advised to avoid heavy lifting and to ask for  help with physical tasks as needed. Blood pressure was noted to be elevated, likely related to pain, and should be closely monitored. The patient should follow up with primary care if symptoms persist beyond several days or worsen, and seek emergency care if there is sudden weakness, numbness, loss of function, or chest pain.  Today's evaluation has revealed no signs of a dangerous process. Discussed diagnosis with patient and/or guardian. Patient and/or guardian aware of their diagnosis, possible red flag symptoms to watch out for and need for close follow up. Patient and/or guardian understands verbal and written discharge instructions. Patient and/or guardian comfortable with plan and disposition.  Patient and/or guardian has a clear mental status at this time, good insight into illness (after discussion and teaching) and has clear judgment to make decisions regarding their care  Documentation was completed with the aid of voice recognition software. Transcription may contain typographical errors. 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 Your symptoms are consistent with a muscular strain. You received injections for inflammation and pain relief during your visit. Robaxin was prescribed for daytime use and Flexeril  for nighttime muscle spasms. Naproxen  was also prescribed to reduce inflammation--do not take aspirin or any over-the-counter NSAIDs while on this medication. To manage your symptoms at home, use both ice and heat throughout the day for 15-20 minutes at a time. Rest as needed, avoid heavy lifting, and ask for help when performing physically demanding tasks. Your blood pressure was noted  to be elevated today. Monitor your blood pressure closely. Follow up with your primary care provider if symptoms do not improve within a few days, become more frequent, or limit your daily function. Go to the emergency department if you experience sudden numbness, increasing weakness, inability to use your arm or hand, chest pain, or shortness of breath.      ED Prescriptions     Medication Sig Dispense Auth. Provider   methocarbamol (ROBAXIN) 500 MG tablet Take 1 tablet (500 mg total) by mouth every morning. 10 tablet Maryruth Sol, FNP   cyclobenzaprine  (FLEXERIL ) 10 MG tablet Take 1 tablet (10 mg total) by mouth at bedtime. 10 tablet Maryruth Sol, FNP   naproxen  (NAPROSYN ) 500 MG tablet Take 1 tablet (500 mg total) by mouth 2 (two) times daily with a meal. 20 tablet Maryruth Sol, FNP      PDMP not reviewed this encounter.   Maryruth Sol, [place] 07/03/23 2006

## 2023-07-03 NOTE — ED Triage Notes (Signed)
 Pt presents with back pain from a work injury about a week ago. Pt says her job says she has to be cleared to return to work.

## 2023-07-03 NOTE — Discharge Instructions (Addendum)
 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 Your symptoms are consistent with a muscular strain. You received injections for inflammation and pain relief during your visit. Robaxin was prescribed for daytime use and Flexeril  for nighttime muscle spasms. Naproxen  was also prescribed to reduce inflammation--do not take aspirin or any over-the-counter NSAIDs while on this medication. To manage your symptoms at home, use both ice and heat throughout the day for 15-20 minutes at a time. Rest as needed, avoid heavy lifting, and ask for help when performing physically demanding tasks. Your blood pressure was noted to be elevated today. Monitor your blood pressure closely. Follow up with your primary care provider if symptoms do not improve within a few days, become more frequent, or limit your daily function. Go to the emergency department if you experience sudden numbness, increasing weakness, inability to use your arm or hand, chest pain, or shortness of breath.

## 2023-09-23 ENCOUNTER — Ambulatory Visit
Admission: EM | Admit: 2023-09-23 | Discharge: 2023-09-23 | Disposition: A | Discharge status: Home / Self Care | Attending: Physician Assistant | Admitting: Physician Assistant

## 2023-09-23 DIAGNOSIS — J069 Acute upper respiratory infection, unspecified: Secondary | ICD-10-CM | POA: Diagnosis not present

## 2023-09-23 DIAGNOSIS — L0291 Cutaneous abscess, unspecified: Secondary | ICD-10-CM

## 2023-09-23 DIAGNOSIS — Z91030 Bee allergy status: Secondary | ICD-10-CM | POA: Diagnosis not present

## 2023-09-23 HISTORY — DX: Allergy, unspecified, initial encounter: T78.40XA

## 2023-09-23 HISTORY — DX: Depression, unspecified: F32.A

## 2023-09-23 HISTORY — DX: Anxiety disorder, unspecified: F41.9

## 2023-09-23 LAB — POC SOFIA SARS ANTIGEN FIA: SARS Coronavirus 2 Ag: NEGATIVE

## 2023-09-23 MED ORDER — DOXYCYCLINE HYCLATE 100 MG PO CAPS: 100.0000 mg | ORAL_CAPSULE | Freq: Two times a day (BID) | ORAL | 0 refills | Status: AC

## 2023-09-23 MED ORDER — IBUPROFEN 600 MG PO TABS: 600.0000 mg | ORAL_TABLET | Freq: Four times a day (QID) | ORAL | 0 refills | Status: AC | PRN

## 2023-09-23 MED ORDER — EPINEPHRINE 0.3 MG/0.3ML IJ SOAJ: 0.3000 mg | INTRAMUSCULAR | 0 refills | Status: AC | PRN

## 2023-09-23 NOTE — ED Triage Notes (Signed)
 Patient normally carries Epi-pen, Needs Rx to have on hand.

## 2023-09-23 NOTE — ED Triage Notes (Signed)
 I have an abscess (recurrent) under my left arm (armpit), very sore, pinching pain. I also have congestion, runny eyes, nose, scratchy throat, body aches/soreness, cold symptoms began Saturday. No fever known.

## 2023-09-29 ENCOUNTER — Encounter: Payer: Self-pay | Admitting: Physician Assistant

## 2023-09-29 NOTE — ED Provider Notes (Addendum)
 [facility]    CSN: [id number] Arrival date & time: 09/23/23  9173      History   Chief Complaint Chief Complaint  Patient presents with   Skin Problem   URI    HPI Nichole Garcia is a 43 y.o. female.   Patient here today for couple complaints.  She reports that she needs an EpiPen  refilled as she typically has this for bee sting allergy.  She also reports that she has a recurrent abscess in her left arm.  She notes that this is a sore area at this point.  She has not any fever.  She also reports that she has had some congestion, runny nose, scratchy throat and bodyaches that started a few days ago.  She has not had any fever with this as well.  The history is provided by the patient.  URI Presenting symptoms: congestion, cough and sore throat   Presenting symptoms: no ear pain and no fever   Associated symptoms: no wheezing     Past Medical History:  Diagnosis Date   Acid reflux    Allergy    Anxiety    Depression    Migraines     Patient Active Problem List   Diagnosis Date Noted   Bacterial vaginitis 01/20/2021   Prediabetes 12/30/2020   Chondromalacia patellae 11/28/2020   Internal derangement of right knee 11/28/2020   Essential hypertension 11/28/2020   Anxiety and depression 11/08/2020    Past Surgical History:  Procedure Laterality Date   TUBAL LIGATION      OB History   No obstetric history on file.      Home Medications    Prior to Admission medications   Medication Sig Start Date End Date Taking? Authorizing Provider  doxycycline  (VIBRAMYCIN ) 100 MG capsule Take 1 capsule (100 mg total) by mouth 2 (two) times daily for 7 days. 09/23/23 09/30/23 Yes Billy Asberry FALCON, PA-C  EPINEPHrine  0.3 mg/0.3 mL IJ SOAJ injection Inject 0.3 mg into the muscle as needed for anaphylaxis. 09/23/23  Yes Billy Asberry FALCON, PA-C  ibuprofen  (ADVIL ) 600 MG tablet Take 1 tablet (600 mg total) by mouth every 6 (six) hours as needed. 09/23/23  Yes Billy Asberry FALCON, PA-C  cetirizine  (ZYRTEC  ALLERGY) 10 MG tablet Take 1 tablet (10 mg total) by mouth daily. 02/11/23   Christopher Savannah, PA-C  cyclobenzaprine  (FLEXERIL ) 10 MG tablet Take 1 tablet (10 mg total) by mouth at bedtime. 07/03/23   Murrill, Samantha, FNP  methocarbamol  (ROBAXIN ) 500 MG tablet Take 1 tablet (500 mg total) by mouth every morning. 07/03/23   Iola Lukes, FNP  naproxen  (NAPROSYN ) 500 MG tablet Take 1 tablet (500 mg total) by mouth 2 (two) times daily with a meal. 07/03/23   Iola Lukes, FNP    Family History Family History  Problem Relation Age of Onset   Hypertension Mother    Diabetes Mother    Heart disease Mother    Stroke Mother    ADD / ADHD Son    Learning disabilities Son     Social History Social History   Tobacco Use   Smoking status: Never    Passive exposure: Never   Smokeless tobacco: Never  Vaping Use   Vaping status: Never Used  Substance Use Topics   Alcohol use: Yes    Comment: Occassionally.   Drug use: Not Currently    Types: Marijuana     Allergies   Bee venom, Asa [aspirin], and Penicillin  g benzathine  Review of Systems Review of Systems  Constitutional:  Negative for chills and fever.  HENT:  Positive for congestion and sore throat. Negative for ear pain.   Eyes:  Negative for discharge and redness.  Respiratory:  Positive for cough. Negative for shortness of breath and wheezing.   Gastrointestinal:  Negative for abdominal pain, diarrhea, nausea and vomiting.  Skin:  Positive for color change and wound.     Physical Exam Triage Vital Signs ED Triage Vitals  Encounter Vitals Group     BP 09/23/23 1011 127/86     Girls Systolic BP Percentile --      Girls Diastolic BP Percentile --      Boys Systolic BP Percentile --      Boys Diastolic BP Percentile --      Pulse Rate 09/23/23 1011 92     Resp 09/23/23 1011 18     Temp 09/23/23 1011 98 F (36.7 C)     Temp Source 09/23/23 1011 Oral     SpO2 09/23/23 1011 95 %      Weight 09/23/23 1008 165 lb (74.8 kg)     Height 09/23/23 1008 5' 4 (1.626 m)     Head Circumference --      Peak Flow --      Pain Score 09/23/23 1005 10     Pain Loc --      Pain Education --      Exclude from Growth Chart --    No data found.  Updated Vital Signs BP 127/86 (BP Location: Right Arm)   Pulse 92   Temp 98 F (36.7 C) (Oral)   Resp 18   Ht 5' 4 (1.626 m)   Wt 165 lb (74.8 kg)   LMP 09/18/2023 (Exact Date)   SpO2 95%   BMI 28.32 kg/m   Visual Acuity Right Eye Distance:   Left Eye Distance:   Bilateral Distance:    Right Eye Near:   Left Eye Near:    Bilateral Near:     Physical Exam Vitals and nursing note reviewed.  Constitutional:      General: She is not in acute distress.    Appearance: Normal appearance. She is not ill-appearing.  HENT:     Head: Normocephalic and atraumatic.     Nose: Congestion present.     Mouth/Throat:     Mouth: Mucous membranes are moist.     Pharynx: No oropharyngeal exudate or posterior oropharyngeal erythema.  Eyes:     Conjunctiva/sclera: Conjunctivae normal.  Cardiovascular:     Rate and Rhythm: Normal rate and regular rhythm.     Heart sounds: Normal heart sounds. No murmur heard. Pulmonary:     Effort: Pulmonary effort is normal. No respiratory distress.     Breath sounds: Normal breath sounds. No wheezing, rhonchi or rales.  Skin:    General: Skin is warm and dry.     Comments: Approximately 4 cm area of induration and erythema to left axillary area with purulent drainage noted  Neurological:     Mental Status: She is alert.  Psychiatric:        Mood and Affect: Mood normal.        Thought Content: Thought content normal.      UC Treatments / Results  Labs (all labs ordered are listed, but only abnormal results are displayed) Labs Reviewed  POC SOFIA SARS ANTIGEN FIA - Normal    EKG   Radiology No results found.  Procedures Procedures (  including critical care time)  Medications  Ordered in UC Medications - No data to display  Initial Impression / Assessment and Plan / UC Course  I have reviewed the triage vital signs and the nursing notes.  Pertinent labs & imaging results that were available during my care of the patient were reviewed by me and considered in my medical decision making (see chart for details).    Suspect viral etiology of upper respiratory symptoms.  COVID screen negative.  Advised symptomatic treatment, increase fluids and rest with follow-up if no gradual improvement of same.  Abscess is draining spontaneously, no indication for incision and drainage.  Recommended she continue to use warm compresses and area bandaged in office.  Doxycycline  prescribed.  Ibuprofen  prescribed for treatment of pain at patient's request.  EpiPen  refilled as requested.  Discussed that she would need to call 911 should she ever need to use EpiPen .  Final Clinical Impressions(s) / UC Diagnoses   Final diagnoses:  Acute upper respiratory infection  Abscess  Bee sting allergy   Discharge Instructions   None    ED Prescriptions     Medication Sig Dispense Auth. Provider   EPINEPHrine  0.3 mg/0.3 mL IJ SOAJ injection Inject 0.3 mg into the muscle as needed for anaphylaxis. 1 each Billy Asberry FALCON, PA-C   doxycycline  (VIBRAMYCIN ) 100 MG capsule Take 1 capsule (100 mg total) by mouth 2 (two) times daily for 7 days. 14 capsule Billy Asberry F, PA-C   ibuprofen  (ADVIL ) 600 MG tablet Take 1 tablet (600 mg total) by mouth every 6 (six) hours as needed. 30 tablet Billy Asberry FALCON, PA-C      PDMP not reviewed this encounter.   Billy Asberry FALCON, PA-C 09/29/23 1417    Billy Asberry FALCON, PA-C 09/29/23 [phone]

## 2023-10-19 IMAGING — DX DG FINGER MIDDLE 2+V*R*
3 series · 3 of 3 positions shown · non-contrast
Comparison: None.

CLINICAL DATA: Slammed middle finger in car door

EXAM:
RIGHT MIDDLE FINGER 2+V

[finger pa (1 of 2)]
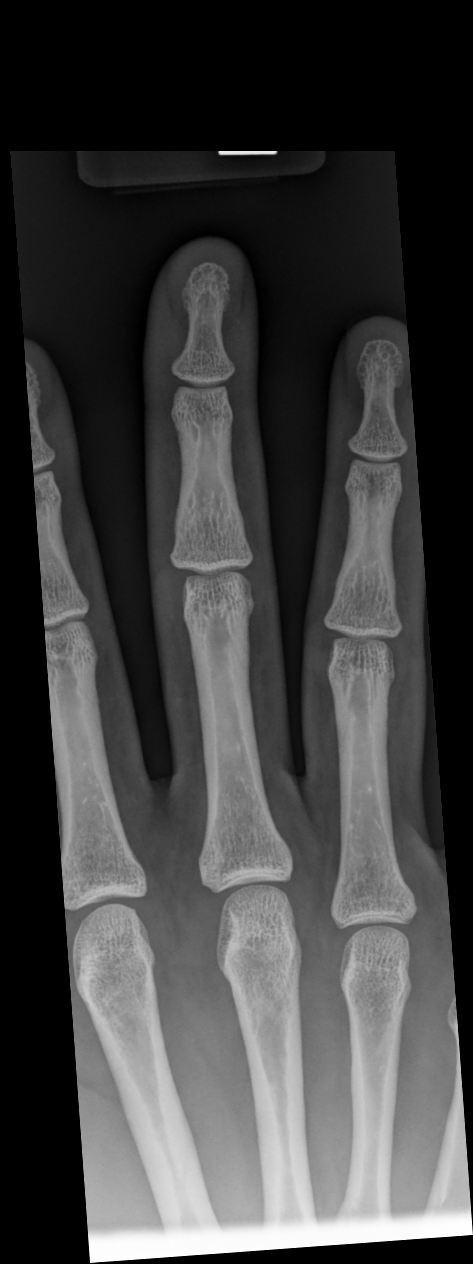

[finger pa (2 of 2)]
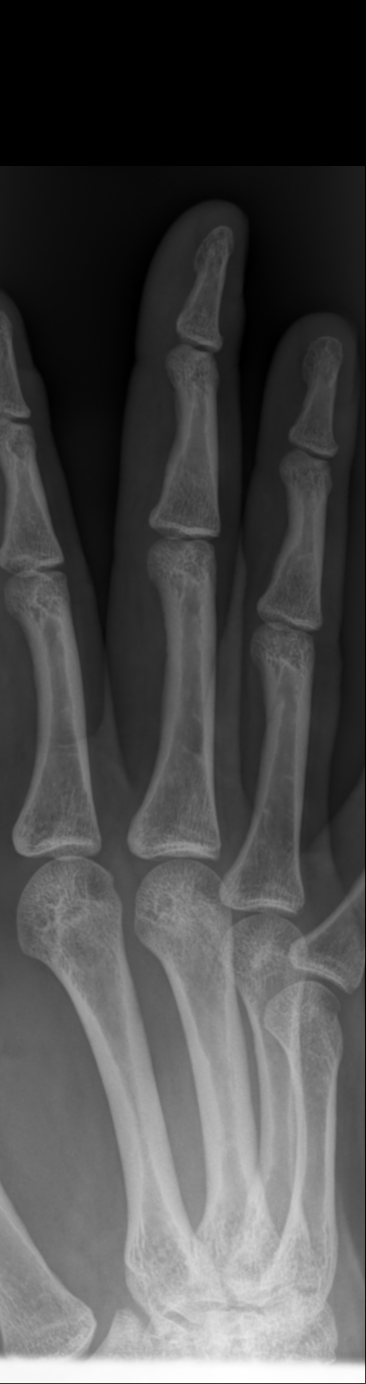

[finger lat]
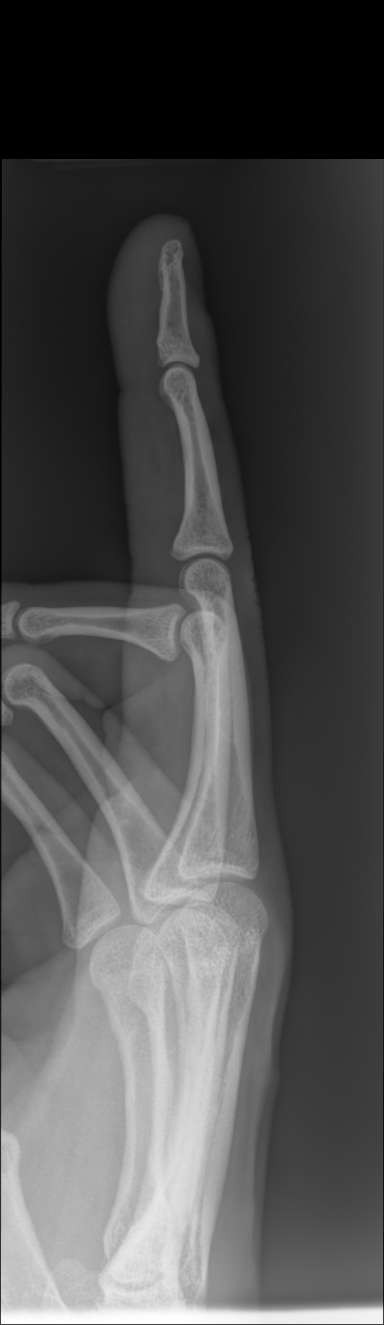

[3 of 3 positions shown; findings below may reference images not displayed]

FINDINGS: There is no acute fracture or dislocation. Bony alignment is normal.
The joint spaces are preserved. There is no erosive change. The soft
tissues are unremarkable.
IMPRESSION: No acute fracture or dislocation.

## 2024-02-19 ENCOUNTER — Encounter: Payer: Self-pay | Admitting: Family

## 2024-03-30 ENCOUNTER — Encounter: Payer: Self-pay | Admitting: Family

## 2024-07-06 ENCOUNTER — Encounter: Payer: Self-pay | Admitting: Family
